# Patient Record
Sex: Female | Born: 1955 | Race: White | Hispanic: No | Marital: Married | Smoking: Former smoker
Health system: Southern US, Community
[De-identification: ages and names within clinical notes are randomized; demographics above are authoritative.]

## PROBLEM LIST (undated history)

## (undated) DIAGNOSIS — E559 Vitamin D deficiency, unspecified: Secondary | ICD-10-CM

## (undated) DIAGNOSIS — F32A Depression, unspecified: Secondary | ICD-10-CM

## (undated) DIAGNOSIS — F419 Anxiety disorder, unspecified: Secondary | ICD-10-CM

## (undated) DIAGNOSIS — R002 Palpitations: Secondary | ICD-10-CM

## (undated) DIAGNOSIS — M810 Age-related osteoporosis without current pathological fracture: Secondary | ICD-10-CM

## (undated) DIAGNOSIS — K219 Gastro-esophageal reflux disease without esophagitis: Secondary | ICD-10-CM

## (undated) DIAGNOSIS — G43909 Migraine, unspecified, not intractable, without status migrainosus: Secondary | ICD-10-CM

## (undated) DIAGNOSIS — E039 Hypothyroidism, unspecified: Secondary | ICD-10-CM

## (undated) DIAGNOSIS — F329 Major depressive disorder, single episode, unspecified: Secondary | ICD-10-CM

## (undated) DIAGNOSIS — I5181 Takotsubo syndrome: Secondary | ICD-10-CM

## (undated) HISTORY — DX: Depression, unspecified: F32.A

## (undated) HISTORY — DX: Anxiety disorder, unspecified: F41.9

## (undated) HISTORY — DX: Vitamin D deficiency, unspecified: E55.9

## (undated) HISTORY — DX: Age-related osteoporosis without current pathological fracture: M81.0

## (undated) HISTORY — DX: Gastro-esophageal reflux disease without esophagitis: K21.9

## (undated) HISTORY — PX: ABDOMINAL HYSTERECTOMY: SHX81

## (undated) HISTORY — DX: Palpitations: R00.2

## (undated) HISTORY — DX: Hypothyroidism, unspecified: E03.9

## (undated) HISTORY — DX: Takotsubo syndrome: I51.81

## (undated) HISTORY — DX: Migraine, unspecified, not intractable, without status migrainosus: G43.909

## (undated) HISTORY — DX: Major depressive disorder, single episode, unspecified: F32.9

---

## 1975-06-03 HISTORY — PX: NASAL SEPTUM SURGERY: SHX37

## 1997-08-21 ENCOUNTER — Ambulatory Visit (HOSPITAL_COMMUNITY): Admission: RE | Admit: 1997-08-21 | Discharge: 1997-08-21 | Payer: Self-pay | Admitting: *Deleted

## 1999-08-14 ENCOUNTER — Encounter: Admission: RE | Admit: 1999-08-14 | Discharge: 1999-08-14 | Payer: Self-pay | Admitting: Internal Medicine

## 1999-08-14 ENCOUNTER — Encounter: Payer: Self-pay | Admitting: Internal Medicine

## 1999-09-10 ENCOUNTER — Encounter: Admission: RE | Admit: 1999-09-10 | Discharge: 1999-10-17 | Payer: Self-pay | Admitting: Internal Medicine

## 2000-02-20 ENCOUNTER — Other Ambulatory Visit: Admission: RE | Admit: 2000-02-20 | Discharge: 2000-02-20 | Payer: Self-pay | Admitting: Obstetrics and Gynecology

## 2000-05-22 ENCOUNTER — Ambulatory Visit (HOSPITAL_COMMUNITY): Admission: RE | Admit: 2000-05-22 | Discharge: 2000-05-22 | Payer: Self-pay | Admitting: Gastroenterology

## 2000-05-22 ENCOUNTER — Encounter: Payer: Self-pay | Admitting: Gastroenterology

## 2002-01-14 ENCOUNTER — Encounter: Admission: RE | Admit: 2002-01-14 | Discharge: 2002-01-14 | Payer: Self-pay | Admitting: Internal Medicine

## 2002-01-14 ENCOUNTER — Encounter: Payer: Self-pay | Admitting: Internal Medicine

## 2002-06-23 ENCOUNTER — Other Ambulatory Visit: Admission: RE | Admit: 2002-06-23 | Discharge: 2002-06-23 | Payer: Self-pay | Admitting: Obstetrics and Gynecology

## 2004-01-31 ENCOUNTER — Other Ambulatory Visit: Admission: RE | Admit: 2004-01-31 | Discharge: 2004-01-31 | Payer: Self-pay | Admitting: Obstetrics and Gynecology

## 2004-04-12 ENCOUNTER — Encounter: Admission: RE | Admit: 2004-04-12 | Discharge: 2004-04-12 | Payer: Self-pay | Admitting: Gastroenterology

## 2005-07-30 ENCOUNTER — Other Ambulatory Visit: Admission: RE | Admit: 2005-07-30 | Discharge: 2005-07-30 | Payer: Self-pay | Admitting: Obstetrics and Gynecology

## 2007-01-18 ENCOUNTER — Ambulatory Visit (HOSPITAL_COMMUNITY): Admission: RE | Admit: 2007-01-18 | Discharge: 2007-01-18 | Payer: Self-pay | Admitting: Gastroenterology

## 2007-03-15 ENCOUNTER — Ambulatory Visit (HOSPITAL_COMMUNITY): Admission: RE | Admit: 2007-03-15 | Discharge: 2007-03-15 | Payer: Self-pay | Admitting: Surgery

## 2007-04-05 ENCOUNTER — Encounter: Admission: RE | Admit: 2007-04-05 | Discharge: 2007-04-05 | Payer: Self-pay | Admitting: Surgery

## 2007-04-12 ENCOUNTER — Encounter: Admission: RE | Admit: 2007-04-12 | Discharge: 2007-04-12 | Payer: Self-pay | Admitting: Surgery

## 2007-04-16 ENCOUNTER — Ambulatory Visit (HOSPITAL_COMMUNITY): Admission: RE | Admit: 2007-04-16 | Discharge: 2007-04-17 | Payer: Self-pay | Admitting: Surgery

## 2007-04-16 ENCOUNTER — Encounter (INDEPENDENT_AMBULATORY_CARE_PROVIDER_SITE_OTHER): Payer: Self-pay | Admitting: Surgery

## 2007-06-03 HISTORY — PX: NISSEN FUNDOPLICATION: SHX2091

## 2008-03-29 IMAGING — NM NM HEPATO W/GB/PHARM/[PERSON_NAME]
1 series · 6 of 6 positions shown · non-contrast
Comparison: none

CLINICAL DATA: Abdominal pain.  Nausea.  
 NUCLEAR MEDICINE HEPATOBILIARY SCAN WITH EJECTION FRACTION:
TECHNIQUE: Sequential abdominal images were obtained following intravenous injection of radiopharmaceutical.  Sequential images were continued following oral ingestion of 8 ounces of half-and-half and the gallbladder ejection fraction was calculated.
 Radiopharmaceutical:  5.0 mCi of Iechnetium-VVm Choletec.
 The radionuclide appears normally throughout the liver. There is rapid excretion into the intrahepatic ductal system with visualization of the common bile duct, gallbladder, and small bowel.  This represents a normal nuclear medicine hepatobiliary scan. The patient was then given 8 ounces of half-and-half cream orally.  The gallbladder ejection fraction was measured at 32% which is abnormally low.

[gb hepatobiliary · 4.66mm/px · 6 of 11 frames shown]
[frame 1/11]
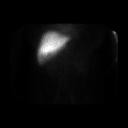
[frame 3/11]
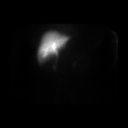
[frame 5/11]
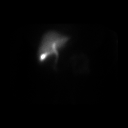
[frame 7/11]
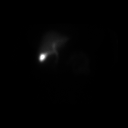
[frame 9/11]
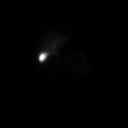
[frame 11/11]
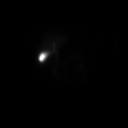

[6 of 6 positions shown; findings below may reference images not displayed]

IMPRESSION: 1.  Normal nuclear medicine hepatobiliary scan.
 2.  Low gallbladder ejection fraction of 32% at one hour.

## 2009-04-03 ENCOUNTER — Encounter (HOSPITAL_COMMUNITY): Admission: RE | Admit: 2009-04-03 | Discharge: 2009-06-01 | Payer: Self-pay | Admitting: Neurosurgery

## 2009-06-02 HISTORY — PX: BREAST REDUCTION SURGERY: SHX8

## 2009-08-27 ENCOUNTER — Ambulatory Visit (HOSPITAL_BASED_OUTPATIENT_CLINIC_OR_DEPARTMENT_OTHER): Admission: RE | Admit: 2009-08-27 | Discharge: 2009-08-28 | Payer: Self-pay | Admitting: Specialist

## 2010-08-26 LAB — POCT HEMOGLOBIN-HEMACUE: Hemoglobin: 13.4 g/dL (ref 12.0–15.0)

## 2010-10-09 ENCOUNTER — Encounter
Payer: Managed Care, Other (non HMO) | Attending: Physical Medicine and Rehabilitation | Admitting: Physical Medicine and Rehabilitation

## 2010-10-09 DIAGNOSIS — M549 Dorsalgia, unspecified: Secondary | ICD-10-CM | POA: Insufficient documentation

## 2010-10-09 DIAGNOSIS — M545 Low back pain, unspecified: Secondary | ICD-10-CM

## 2010-10-09 DIAGNOSIS — F341 Dysthymic disorder: Secondary | ICD-10-CM | POA: Insufficient documentation

## 2010-10-09 DIAGNOSIS — M47817 Spondylosis without myelopathy or radiculopathy, lumbosacral region: Secondary | ICD-10-CM | POA: Insufficient documentation

## 2010-10-10 NOTE — Group Therapy Note (Signed)
[name] is a pleasant 55 year old married woman who reports she has had back pain since 2007.  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  She states the pain continued to get worse.  She has seen a variety of health care providers over the last several years now.  She saw Dr. Channing Mutters in 2010, eventually obtained an MRI of her lumbar spine after x-rays and a bone scan were completed.  The MRI according to Dr. Temple Pacini notes showed some degenerative changes at L4-5 and L3-4 and she was treated nonoperatively.  She attended physical therapy in January 2011.  She eventually underwent a breast reduction in March 2011 per Dr. Shon Hough. She reports that this did help her mid back pain some, but not the low back.  She has tried water therapy.  She also saw Dr. Sharolyn Douglas.  She trialed muscle relaxers and injections and continues to use heat and ice intermittently.  She also went through a core strengthening program, was treated for some depression.  She is considering getting disability. She has an attorney currently and she still follows up with a chiropractor who sees her regularly.  Her goals at this time are to be able to sit through a meal and do something to earn some money again.  Her average pain is about 7 on a scale of 10.  Sleep tends to be poor. Pain is worse with sitting and standing.  Improves with rest, heat, ice, exercise, pacing her activities.  She reports fairly little relief with current meds.  Functional status.  She can walk about 30 minutes.  She does drive although driving does increase her low back pain.  She has a high school education and she has multiple life insures.  She is independent with feeding, dressing, bathing and toileting as well as meal prep.  She reports needing some assistance with household duties and shopping.  REVIEW OF SYSTEMS:  Positive for depression and anxiety which she relates are due  to her back pain.  She also reports night sweats, intermittent nausea, diarrhea, and constipation.  Current physicians involved in her care include her chiropractor, Dr. Channing Mutters her neurosurgeon, Dr. Evelene Croon her psychiatrist, Dr. Selena Batten and Dr. Shon Hough.  PAST MEDICAL HISTORY:  Positive for history of thyroid problems.  PAST SURGICAL HISTORY:  Breast reduction in March 2011, Nissen fundoplication October 2009, deviated septum in 1996, hysterectomy in 1990.  SOCIAL HISTORY:  The patient lives with her husband.  She denies illegal substance use.  She uses alcohol twice a year.  Denies smoking.  FAMILY HISTORY:  Positive for history of colon cancer with respect to her father who died at age 35.  Her mother age 27 is healthy.  She has 2 brothers and 2 sisters, 68, 30, 60 and 4.  One sister has cervical cancer.  Medications she brings in today include cyclobenzaprine 10 mg once a day.  She has been discontinued recently on trazodone, Cymbalta and Adderall.  She reports CYMBALTA made her face swell, TRAZODONE causes hair loss, and CODEINE makes her itch.  On exam today; her blood pressure is 107/61, pulse 69, respirations 18, 99% saturated on room air.  She is a well-developed, well-nourished woman who does not appear in any distress.  She is oriented x3.  Speech is clear.  Affect is bright.  She is alert, cooperative, and pleasant. Follows commands without difficulty.  Answers my questions appropriately.  Cranial nerves and coordination  are intact.  Her reflexes are 2+ in upper and lower extremities without clonus.  Hoffman test is negative.  Motor strength is good in both upper extremities, deltoid, biceps, triceps, brachioradialis, finger flexors and intrinsics.  Lower extremity strength 5/5 at hip flexors, knee extensors, dorsiflexors, plantar flexors, EHL.  Straight leg raise is negative.  No sensory deficits are appreciated in the upper or lower extremities.  Tandem gait and  Romberg test are all performed adequately.  She has some limitations in lumbar motion, reports increased pain with extension.  Palpation over the trochanters did not elicit significant tenderness over the greater trochanter region.  IMPRESSION:  Chronic low back pain.  We will obtain flexion/extension radiographs, review MRI.  I will review treatment options further with her after x-rays and MRI are reviewed.  May consider having her core muscle strength checked for physical therapy.  She states she has been doing some exercises.  I may have them reviewed her current exercise regime and also check her core strength to see how she is doing on that. I have answered all her questions.  She is comfortable with our plan.     Brantley Stage, M.D.    DMK/MedQ D:  10/09/2010 14:52:45  T:  10/10/2010 03:28:31  Job #:  [id number]

## 2010-10-15 NOTE — Op Note (Signed)
NAMEDYMIN, DINGLEDINE               ACCOUNT NO.:  [id number]   MEDICAL RECORD NO.:  [id number]          PATIENT TYPE:  AMB   LOCATION:  ENDO                         FACILITY:  [facility]   PHYSICIAN:  Petra Kuba, M.D.    DATE OF BIRTH:  1955/12/24   DATE OF PROCEDURE:  01/18/2007  DATE OF DISCHARGE:  01/18/2007                               OPERATIVE REPORT   PROCEDURE:  Manometry and 24 hour pH.   INDICATIONS:  Patient with reflux requested by Dr. Carolynne Edouard preop in a  patient with known hiatal hernia.   As an aside, the endoscopy unit did have some problems with the  equipment which did delay getting some of these results.   Manometry report and the pH were done in the [facility] in the customary  fashion and the computer generated report was given to me for my  evaluation.   MANOMETRY:  1. Upper esophageal sphincter. Slight increase in pressure but 99%      relaxation, normal residual pressure, slightly long duration, with      normal laryngeal peak pressures.  2. Esophageal body.      a.     Low normal amplitude was normal duration, normal velocity,       and 100% peristalsis.      b.     Upper low normal amplitude, normal duration and waveforms       appeared normal.  3. Lower esophageal sphincter. Very low normal LES pressure 10.1 with      normal being 10.  Unfortunately, the probe did not measure the      amount it relaxed or percent relaxation or duration which was one      of the malfunction of the probe.   IMPRESSION:  1. Essentially normal manometry with low normal LES and amplitude.      Unfortunately, no residual pressures or percent relaxation were      measured.   24 HOUR PH:  Assessment:  1. In the distal channel the patient did have a moderate amount of      reflux with a total composite DeMeester score of 112, normal being      22 or less, with both increased percentage time of acid reflux in      the upright, recumbent,and total, reflux episodes greater than  five      minutes were few, however, the longest was 45 minutes.  In the      proximal channel, however, she really had minimal reflux but      unfortunately the computer did not calculate the normals, but I      believe her values were all within normal limits.  Her symptoms did      not coordinate with her reflux episodes which goes along with      minimal proximal reflux, possibly.   ASSESSMENT:  Probably okay for hiatal hernia repair, although not a  complete study based on some malfunctioning of the probe and of the  computer reports.   PLAN:  Further workup and plans per Dr. Carolynne Edouard, I will be on standby  to  help p.r.n.           ______________________________  Petra Kuba, M.D.     MEM/MEDQ  D:  02/04/2007  T:  02/04/2007  Job:  [id number]   cc:   Erskine Speed, M.D.  Fax: [phone]   [facility] Salvia. Marcelle Overlie, M.D.  Fax: [phone]   Ollen Gross. Vernell Morgans, M.D.  [address]  [place]  [place] [address]

## 2010-10-15 NOTE — Discharge Summary (Signed)
NAMEISAURA, SCHILLER               ACCOUNT NO.:  [id number]   MEDICAL RECORD NO.:  [id number]          PATIENT TYPE:  OIB   LOCATION:  1540                         FACILITY:  [facility]   PHYSICIAN:  Lennie Muckle, MD      DATE OF BIRTH:  1955/11/21   DATE OF ADMISSION:  04/16/2007  DATE OF DISCHARGE:  04/17/2007                               DISCHARGE SUMMARY   Ms. Dewilde is a 55 year old female who had a laparoscopic paraesophageal  hernia repair, laparoscopic Nissen, over 56-French bougie, as well as a  laparoscopic cholecystectomy.  Postoperatively, she has done well, had  no difficulty swallowing liquids, is advancing to puree diet, has been  afebrile overnight.  Pain is well controlled, has mild complaints of  abdominal discomfort, otherwise is ambulating, voiding without  difficulty.  She will be discharged home with oxycodone elixir, is  instructed to perform no heavy lifting greater than 20 pounds for 2  weeks.  Also, was given Tylenol elixir for pain control.  The  prescriptions were given to her husband at discharge by Dr. Michaell Cowing.  She  will continue her Flexeril and iron thyroid supplement.  She will follow  up with Dr. Michaell Cowing in approximately 2 to 3 weeks.  She can shower and  remove her dressings tomorrow.      Lennie Muckle, MD  Electronically Signed     ALA/MEDQ  D:  04/17/2007  T:  04/18/2007  Job:  [id number]

## 2010-11-13 ENCOUNTER — Ambulatory Visit: Payer: Managed Care, Other (non HMO) | Admitting: Physical Medicine and Rehabilitation

## 2011-02-13 ENCOUNTER — Other Ambulatory Visit: Payer: Self-pay | Admitting: Gastroenterology

## 2011-03-11 LAB — COMPREHENSIVE METABOLIC PANEL
ALT: 15
AST: 14
Albumin: 4
Alkaline Phosphatase: 51
BUN: 7
CO2: 26
Calcium: 9
Chloride: 106
Creatinine, Ser: 0.92
GFR calc Af Amer: >60
GFR calc non Af Amer: >60
Glucose, Bld: 91
Potassium: 4.2
Sodium: 138
Total Bilirubin: 0.7
Total Protein: 6.7

## 2011-03-11 LAB — URINALYSIS, ROUTINE W REFLEX MICROSCOPIC
Bilirubin Urine: NEGATIVE
Glucose, UA: NEGATIVE
Hgb urine dipstick: NEGATIVE
Ketones, ur: NEGATIVE
Nitrite: NEGATIVE
Protein, ur: NEGATIVE
Specific Gravity, Urine: 1.026
Urobilinogen, UA: 0.2
pH: 5

## 2011-03-11 LAB — CBC
HCT: 41.5
Hemoglobin: 14.2
MCHC: 34.3
MCV: 89
Platelets: 243
RBC: 4.67
RDW: 14.3
WBC: 5.4

## 2011-03-11 LAB — T3: T3, Total: 110.6 (ref 80.0–204.0)

## 2011-03-11 LAB — T4: T4, Total: 6.6

## 2011-03-11 LAB — TSH: TSH: 7.75 — ABNORMAL HIGH

## 2011-03-11 LAB — T4, FREE: Free T4: 0.98

## 2014-03-15 ENCOUNTER — Other Ambulatory Visit: Payer: Self-pay | Admitting: Obstetrics and Gynecology

## 2014-03-16 LAB — CYTOLOGY - PAP

## 2014-06-21 ENCOUNTER — Ambulatory Visit: Payer: Managed Care, Other (non HMO) | Admitting: Family

## 2014-07-12 ENCOUNTER — Ambulatory Visit: Payer: Managed Care, Other (non HMO) | Admitting: Family

## 2014-09-25 DIAGNOSIS — E039 Hypothyroidism, unspecified: Secondary | ICD-10-CM | POA: Diagnosis not present

## 2014-09-25 DIAGNOSIS — E559 Vitamin D deficiency, unspecified: Secondary | ICD-10-CM | POA: Diagnosis not present

## 2014-09-25 DIAGNOSIS — E063 Autoimmune thyroiditis: Secondary | ICD-10-CM | POA: Diagnosis not present

## 2015-05-09 DIAGNOSIS — Z1231 Encounter for screening mammogram for malignant neoplasm of breast: Secondary | ICD-10-CM | POA: Diagnosis not present

## 2015-05-09 DIAGNOSIS — Z6824 Body mass index (BMI) 24.0-24.9, adult: Secondary | ICD-10-CM | POA: Diagnosis not present

## 2015-05-09 DIAGNOSIS — Z01419 Encounter for gynecological examination (general) (routine) without abnormal findings: Secondary | ICD-10-CM | POA: Diagnosis not present

## 2015-06-06 DIAGNOSIS — H524 Presbyopia: Secondary | ICD-10-CM | POA: Diagnosis not present

## 2015-06-21 DIAGNOSIS — J111 Influenza due to unidentified influenza virus with other respiratory manifestations: Secondary | ICD-10-CM | POA: Diagnosis not present

## 2015-07-31 DIAGNOSIS — F3181 Bipolar II disorder: Secondary | ICD-10-CM | POA: Diagnosis not present

## 2015-07-31 DIAGNOSIS — E039 Hypothyroidism, unspecified: Secondary | ICD-10-CM | POA: Diagnosis not present

## 2015-07-31 DIAGNOSIS — I10 Essential (primary) hypertension: Secondary | ICD-10-CM | POA: Diagnosis not present

## 2015-07-31 DIAGNOSIS — E559 Vitamin D deficiency, unspecified: Secondary | ICD-10-CM | POA: Diagnosis not present

## 2015-07-31 DIAGNOSIS — E063 Autoimmune thyroiditis: Secondary | ICD-10-CM | POA: Diagnosis not present

## 2015-09-04 DIAGNOSIS — N390 Urinary tract infection, site not specified: Secondary | ICD-10-CM | POA: Diagnosis not present

## 2015-12-18 DIAGNOSIS — E7800 Pure hypercholesterolemia, unspecified: Secondary | ICD-10-CM | POA: Diagnosis not present

## 2015-12-18 DIAGNOSIS — E559 Vitamin D deficiency, unspecified: Secondary | ICD-10-CM | POA: Diagnosis not present

## 2015-12-18 DIAGNOSIS — N390 Urinary tract infection, site not specified: Secondary | ICD-10-CM | POA: Diagnosis not present

## 2015-12-18 DIAGNOSIS — Z79899 Other long term (current) drug therapy: Secondary | ICD-10-CM | POA: Diagnosis not present

## 2015-12-18 DIAGNOSIS — Z0000 Encounter for general adult medical examination without abnormal findings: Secondary | ICD-10-CM | POA: Diagnosis not present

## 2015-12-18 DIAGNOSIS — E039 Hypothyroidism, unspecified: Secondary | ICD-10-CM | POA: Diagnosis not present

## 2016-01-01 DIAGNOSIS — E559 Vitamin D deficiency, unspecified: Secondary | ICD-10-CM | POA: Diagnosis not present

## 2016-01-01 DIAGNOSIS — A499 Bacterial infection, unspecified: Secondary | ICD-10-CM | POA: Diagnosis not present

## 2016-01-01 DIAGNOSIS — N390 Urinary tract infection, site not specified: Secondary | ICD-10-CM | POA: Diagnosis not present

## 2016-04-02 DIAGNOSIS — H9313 Tinnitus, bilateral: Secondary | ICD-10-CM | POA: Diagnosis not present

## 2016-04-02 DIAGNOSIS — E559 Vitamin D deficiency, unspecified: Secondary | ICD-10-CM | POA: Diagnosis not present

## 2016-05-06 DIAGNOSIS — Z800 Family history of malignant neoplasm of digestive organs: Secondary | ICD-10-CM | POA: Diagnosis not present

## 2016-05-06 DIAGNOSIS — Z86010 Personal history of colonic polyps: Secondary | ICD-10-CM | POA: Diagnosis not present

## 2016-05-06 DIAGNOSIS — D123 Benign neoplasm of transverse colon: Secondary | ICD-10-CM | POA: Diagnosis not present

## 2016-05-06 DIAGNOSIS — K5730 Diverticulosis of large intestine without perforation or abscess without bleeding: Secondary | ICD-10-CM | POA: Diagnosis not present

## 2016-05-06 DIAGNOSIS — K635 Polyp of colon: Secondary | ICD-10-CM | POA: Diagnosis not present

## 2016-05-06 DIAGNOSIS — D126 Benign neoplasm of colon, unspecified: Secondary | ICD-10-CM | POA: Diagnosis not present

## 2016-05-09 DIAGNOSIS — K635 Polyp of colon: Secondary | ICD-10-CM | POA: Diagnosis not present

## 2016-05-09 DIAGNOSIS — D126 Benign neoplasm of colon, unspecified: Secondary | ICD-10-CM | POA: Diagnosis not present

## 2016-05-12 DIAGNOSIS — M816 Localized osteoporosis [Lequesne]: Secondary | ICD-10-CM | POA: Diagnosis not present

## 2016-05-12 DIAGNOSIS — N958 Other specified menopausal and perimenopausal disorders: Secondary | ICD-10-CM | POA: Diagnosis not present

## 2016-05-12 DIAGNOSIS — Z01419 Encounter for gynecological examination (general) (routine) without abnormal findings: Secondary | ICD-10-CM | POA: Diagnosis not present

## 2016-05-12 DIAGNOSIS — Z13820 Encounter for screening for osteoporosis: Secondary | ICD-10-CM | POA: Diagnosis not present

## 2016-05-12 DIAGNOSIS — Z6824 Body mass index (BMI) 24.0-24.9, adult: Secondary | ICD-10-CM | POA: Diagnosis not present

## 2016-05-23 DIAGNOSIS — Z1231 Encounter for screening mammogram for malignant neoplasm of breast: Secondary | ICD-10-CM | POA: Diagnosis not present

## 2018-04-15 ENCOUNTER — Encounter: Payer: Self-pay | Admitting: Neurology

## 2018-04-19 ENCOUNTER — Telehealth: Payer: Self-pay | Admitting: Neurology

## 2018-04-19 ENCOUNTER — Encounter: Payer: Self-pay | Admitting: *Deleted

## 2018-04-19 ENCOUNTER — Encounter: Payer: Self-pay | Admitting: Neurology

## 2018-04-19 ENCOUNTER — Ambulatory Visit: Payer: Medicare Other | Admitting: Neurology

## 2018-04-19 VITALS — BP 122/75 | HR 63 | Ht 62.0 in | Wt 128.0 lb

## 2018-04-19 DIAGNOSIS — M542 Cervicalgia: Secondary | ICD-10-CM | POA: Insufficient documentation

## 2018-04-19 DIAGNOSIS — E7800 Pure hypercholesterolemia, unspecified: Secondary | ICD-10-CM | POA: Insufficient documentation

## 2018-04-19 DIAGNOSIS — R519 Headache, unspecified: Secondary | ICD-10-CM

## 2018-04-19 DIAGNOSIS — R51 Headache: Secondary | ICD-10-CM

## 2018-04-19 DIAGNOSIS — E079 Disorder of thyroid, unspecified: Secondary | ICD-10-CM

## 2018-04-19 DIAGNOSIS — E039 Hypothyroidism, unspecified: Secondary | ICD-10-CM | POA: Insufficient documentation

## 2018-04-19 MED ORDER — RIZATRIPTAN BENZOATE 5 MG PO TBDP: 5.0000 mg | ORAL_TABLET | ORAL | 12 refills | Status: DC | PRN

## 2018-04-19 NOTE — Telephone Encounter (Signed)
Patient is aware I gave her GI phone number of [phone] and to give them a call if she has not heard in the next 2-3 business days.  °

## 2018-04-19 NOTE — Progress Notes (Signed)
PATIENT: Heidi Evans DOB: [birth year]  Chief Complaint  Patient presents with  . New Patient (Initial Visit)    she is here with her husband Darrell for evaluation of headaches, tinnitus w/ hearing loss. Eyes have different prescriptions. Seen by [facility]. Has sleep disturbance and light sensitivity. Has been going on for awhile but worse in the last 6 months.   . Referral    Kellie Shropshire MD      HISTORICAL  Heidi Evans is a 62 year old female, seen in request by her primary care physician Dr. Royce Macadamia, Cristopher Estimable for evaluation of chronic headache, left neck pain, initial evaluation was on verbal 18 2019, she is accompanied by her husband at today's clinical visit.  I have reviewed and summarized the referring note from the referring physician.  She had a past medical history of bipolar disorder, is under the care of psychiatrist Dr. Edwyna Perfect, taking polypharmacy treatment, gabapentin 100 mg at bedtime, Trileptal 300 mg at bedtime, lamotrigine 400 mg a day,  She does have a history of chronic migraine headaches, previous migraine or retro-orbital area severe pounding headache with associated light noise sensitivity, nauseous, since 2015, she began to have daily headaches, woke up with holoacranial pressure headaches, also has light noise sensitivity, flashing light in her peripheral visual field, mild nauseous, lasting all day, she tried Tylenol without helping her symptoms.  In addition, she complains of left-sided neck pain, radiating pain to left shoulder, upper extremity, she denies trouble walking, left upper extremity sensorimotor deficit.  She has trouble sleeping, denies snoring, choking episodes.  REVIEW OF SYSTEMS: Full 14 system review of systems performed and notable only for weight loss, fatigue, chest pain, palpitation, hearing loss, ringing in the ear, blurry vision, double vision, eye pain, easy bruising, easy bleeding, feeling cold, memory loss, confusion,  headaches, passing out, insomnia, depression, anxiety, not enough energy, decreased energy, racing thoughts, skin sensitivity. All other review of systems were negative.  ALLERGIES: Allergies  Allergen Reactions  . Codeine Itching    HOME MEDICATIONS: Current Outpatient Medications  Medication Sig Dispense Refill  . diazepam (VALIUM) 5 MG tablet Take 5 mg by mouth 2 (two) times daily as needed for anxiety.    . gabapentin (NEURONTIN) 100 MG capsule Take 100 mg by mouth at bedtime.     . lamoTRIgine (LAMICTAL) 200 MG tablet Take 200-400 mg by mouth daily.     Marland Kitchen levothyroxine (SYNTHROID, LEVOTHROID) 100 MCG tablet Take 100 mcg by mouth daily. Alternate with levothyroxine 88 mcg    . levothyroxine (SYNTHROID, LEVOTHROID) 88 MCG tablet Take 88 mcg by mouth daily before breakfast. Alternate with levothyroxine 100 mcg    . Oxcarbazepine (TRILEPTAL) 300 MG tablet Take 300 mg by mouth at bedtime.     No current facility-administered medications for this visit.     PAST MEDICAL HISTORY: Past Medical History:  Diagnosis Date  . Acid reflux   . Anxiety   . Depression   . Hypothyroidism   . Migraine   . Osteoporosis   . Vitamin D deficiency     PAST SURGICAL HISTORY: Past Surgical History:  Procedure Laterality Date  . ABDOMINAL HYSTERECTOMY     total  . BREAST REDUCTION SURGERY  2011   Dr. Towanda Malkin  . NASAL SEPTUM SURGERY  1977  . NISSEN FUNDOPLICATION  0086   Dr. Johney Maine    FAMILY HISTORY: Family History  Problem Relation Age of Onset  . Migraines Mother   . Colon  cancer Father   . Headache Other        brain tumor  . Bipolar disorder Other        "in my family"  . Mental illness Other        "for different ones"    SOCIAL HISTORY: Social History   Socioeconomic History  . Marital status: Married    Spouse name: Not on file  . Number of children: 1  . Years of education: h.s. plus insurance vocational  . Highest education level: High school graduate    Occupational History  . Not on file  Social Needs  . Financial resource strain: Not on file  . Food insecurity:    Worry: Not on file    Inability: Not on file  . Transportation needs:    Medical: Not on file    Non-medical: Not on file  Tobacco Use  . Smoking status: Former Smoker    Types: Cigarettes    Last attempt to quit: 1994    Years since quitting: 25.8  . Smokeless tobacco: Never Used  Substance and Sexual Activity  . Alcohol use: Yes    Comment: rare 1-2 times per year  . Drug use: Not Currently    Types: Marijuana    Comment: in the past  . Sexual activity: Not on file  Lifestyle  . Physical activity:    Days per week: Not on file    Minutes per session: Not on file  . Stress: Not on file  Relationships  . Social connections:    Talks on phone: Not on file    Gets together: Not on file    Attends religious service: Not on file    Active member of club or organization: Not on file    Attends meetings of clubs or organizations: Not on file    Relationship status: Not on file  . Intimate partner violence:    Fear of current or ex partner: Not on file    Emotionally abused: Not on file    Physically abused: Not on file    Forced sexual activity: Not on file  Other Topics Concern  . Not on file  Social History Narrative   Lives at home with spouse   Right handed   Caffeine: 1.5 cups daily     PHYSICAL EXAM   Vitals:   04/19/18 0734  BP: 122/75  Pulse: 63  Weight: 128 lb (58.1 kg)  Height: 5\' 2"  (1.575 m)    Not recorded      Body mass index is 23.41 kg/m.  PHYSICAL EXAMNIATION:  Gen: NAD, conversant, well nourised, obese, well groomed                     Cardiovascular: Regular rate rhythm, no peripheral edema, warm, nontender. Eyes: Conjunctivae clear without exudates or hemorrhage Neck: Supple, no carotid bruits. Pulmonary: Clear to auscultation bilaterally   NEUROLOGICAL EXAM:  MENTAL STATUS: Speech:    Speech is normal; fluent  and spontaneous with normal comprehension.  Cognition:     Orientation to time, place and person     Normal recent and remote memory     Normal Attention span and concentration     Normal Language, naming, repeating,spontaneous speech     Fund of knowledge   CRANIAL NERVES: CN II: Visual fields are full to confrontation. Fundoscopic exam is normal with sharp discs and no vascular changes. Pupils are round equal and briskly reactive to light. CN  III, IV, VI: extraocular movement are normal. No ptosis. CN V: Facial sensation is intact to pinprick in all 3 divisions bilaterally. Corneal responses are intact.  CN VII: Face is symmetric with normal eye closure and smile. CN VIII: Hearing is normal to rubbing fingers CN IX, X: Palate elevates symmetrically. Phonation is normal. CN XI: Head turning and shoulder shrug are intact CN XII: Tongue is midline with normal movements and no atrophy.  MOTOR: There is no pronator drift of out-stretched arms. Muscle bulk and tone are normal. Muscle strength is normal.  REFLEXES: Reflexes are 2+ and symmetric at the biceps, triceps, knees, and ankles. Plantar responses are flexor.  SENSORY: Intact to light touch, pinprick, positional sensation and vibratory sensation are intact in fingers and toes.  COORDINATION: Rapid alternating movements and fine finger movements are intact. There is no dysmetria on finger-to-nose and heel-knee-shin.    GAIT/STANCE: Posture is normal. Gait is steady with normal steps, base, arm swing, and turning. Heel and toe walking are normal. Tandem gait is normal.  Romberg is absent.   DIAGNOSTIC DATA (LABS, IMAGING, TESTING) - I reviewed patient records, labs, notes, testing and imaging myself where available.   ASSESSMENT AND PLAN  RAYMONDE HAMBLIN is a 62 y.o. female   Chronic daily headaches  MRI of the brain to rule out structural lesion  With migraine features, her mood disorder likely play a role as well  She  is on polypharmacy already, Maxalt 5 mg as needed for severe prolonged headaches  Left neck pain, radiating pain to left shoulder upper extremity  MRI of the cervical spine to rule out left cervical radiculopathy    Marcial Pacas, M.D. Ph.D.  [facility] [address] [place], [place] [address] Ph: [phone] Fax: [phone]  CC: Sherald Hess., MD

## 2018-04-19 NOTE — Telephone Encounter (Signed)
[employer] medicare order sent to GI. No auth they will reach out to the pt to schedule.  °

## 2018-05-01 ENCOUNTER — Ambulatory Visit
Admission: RE | Admit: 2018-05-01 | Discharge: 2018-05-01 | Disposition: A | Discharge status: Home / Self Care | Payer: Managed Care, Other (non HMO) | Source: Ambulatory Visit | Attending: Neurology | Admitting: Neurology

## 2018-05-01 DIAGNOSIS — R51 Headache: Secondary | ICD-10-CM | POA: Diagnosis not present

## 2018-05-01 DIAGNOSIS — M542 Cervicalgia: Secondary | ICD-10-CM | POA: Diagnosis not present

## 2018-05-01 DIAGNOSIS — R519 Headache, unspecified: Secondary | ICD-10-CM

## 2018-05-03 ENCOUNTER — Telehealth: Payer: Self-pay | Admitting: Neurology

## 2018-05-03 DIAGNOSIS — R930 Abnormal findings on diagnostic imaging of skull and head, not elsewhere classified: Secondary | ICD-10-CM

## 2018-05-03 NOTE — Telephone Encounter (Signed)
Left message requesting a return call.

## 2018-05-03 NOTE — Telephone Encounter (Addendum)
Please call patient, MRI of brain showed small vessel disease, 8x8x9 mm left prepontine area calcified mass, likely meningioma, give her a follow up visit in 3 months, will have repeat MRI w/wo before follow up visit to better characterize.  MRI cervical showed multilevel degenerative changes, moderate foraminal narrowing at c6-7 level, potential encroaching on left C7, no evidence of spinal cord compression.     IMPRESSION: This MRI of the brain without contrast shows the following: 1.    Scattered T2/FLAIR hypertense foci in the subcortical deep white matter consistent with mild chronic microvascular ischemic change or the sequela of migraine. 2.    There is an 8 x 8 x 9 mm focus in the left prepontine space that is slightly distorts the adjacent pons.  It appears to be heavily calcified and might represent a pre-pontine meningioma or other mass.  Consider reimaging with postcontrast views and thin sections through the pons and prepontine cistern to better characterize. 3.    There are no acute findings.   IMPRESSION: This MRI of the cervical spine without contrast shows the following: 1.   Multilevel degenerative changes causing borderline spinal stenosis at C3-C4, C5-C6 and C6-C7. 2.   There is moderate foraminal narrowing at C6-C7 with degenerative changes encroaching upon the left C7 nerve root without causing definite nerve root compression.  There is less potential for nerve root compression at the other cervical levels. 3.    The spinal cord appears normal.

## 2018-05-03 NOTE — Telephone Encounter (Signed)
Noted, I will send the order to GI. They will reach out to the pt to schedule.

## 2018-05-03 NOTE — Telephone Encounter (Signed)
[employer] medicare order sent to GI. No auth they will reach out to the pt to schedule.  °

## 2018-05-03 NOTE — Telephone Encounter (Signed)
Patient returned the call. Please call and advise.

## 2018-05-03 NOTE — Telephone Encounter (Signed)
Returned call to patient and notified her of the cervical and brain MRI results as noted below.  She is agreeable to have repeat MRI prior to her next appt on 07/19/17.

## 2018-05-07 NOTE — Telephone Encounter (Signed)
Heidi Evans  Imaging has called asking if Dr Krista Blue wants pt's MRI done closer to her next office visit in Feb 2020.  Heidi Evans is asking to be called back at [phone] xt [phone]

## 2018-05-07 NOTE — Telephone Encounter (Signed)
Called, LVM for Terr[place] at [facility] imaging that she can have MRI completed prior to appt in 07/2018.

## 2018-05-19 ENCOUNTER — Other Ambulatory Visit: Payer: Managed Care, Other (non HMO)

## 2018-07-09 ENCOUNTER — Ambulatory Visit: Payer: Managed Care, Other (non HMO) | Admitting: Neurology

## 2018-07-12 ENCOUNTER — Ambulatory Visit
Admission: RE | Admit: 2018-07-12 | Discharge: 2018-07-12 | Disposition: A | Discharge status: Home / Self Care | Payer: Medicare Other | Source: Ambulatory Visit | Attending: Neurology | Admitting: Neurology

## 2018-07-12 DIAGNOSIS — R930 Abnormal findings on diagnostic imaging of skull and head, not elsewhere classified: Secondary | ICD-10-CM | POA: Diagnosis not present

## 2018-07-12 MED ORDER — GADOBENATE DIMEGLUMINE 529 MG/ML IV SOLN
10.0000 mL | Freq: Once | INTRAVENOUS | Status: AC | PRN
  Administered 2018-07-12: 10 mL via INTRAVENOUS

## 2018-07-14 ENCOUNTER — Telehealth: Payer: Self-pay | Admitting: Neurology

## 2018-07-14 NOTE — Telephone Encounter (Signed)
Please call patient, MRI of brain w/wo showed left prepontine area enhancing extra-axial mass 11x8x57mm, heavily calcified, likely meningioma. No significant change compared to previous scan in Dec 2019.   IMPRESSION: This MRI of the brain with and without contrast shows the following: 1.    There is an 11 x 8 x 10 mm enhancing extra-axial mass in the pre-pontine cistern on the left.    It appears to be heavily calcified and is most consistent with a prepontine meningioma.  It is adjacent to the left trigeminal nerve as a nerve enters Meckel's cave without clearly distorting it. 2.    Some scattered T2/flair hyperintense foci in the hemispheres consistent with mild stable chronic microvascular ischemic change. 3.    No acute findings.

## 2018-07-14 NOTE — Telephone Encounter (Signed)
Spoke to patient.  She is aware of her MRI results and will keep her pending appt to further review.

## 2018-07-19 ENCOUNTER — Ambulatory Visit: Payer: Medicare Other | Admitting: Neurology

## 2018-07-19 ENCOUNTER — Ambulatory Visit: Payer: Self-pay | Admitting: Neurology

## 2018-07-19 ENCOUNTER — Encounter: Payer: Self-pay | Admitting: Neurology

## 2018-07-19 VITALS — BP 127/74 | HR 65 | Ht 62.0 in | Wt 128.0 lb

## 2018-07-19 DIAGNOSIS — R51 Headache: Secondary | ICD-10-CM

## 2018-07-19 DIAGNOSIS — R519 Headache, unspecified: Secondary | ICD-10-CM

## 2018-07-19 MED ORDER — ERENUMAB-AOOE 70 MG/ML ~~LOC~~ SOAJ: 70.0000 mg | SUBCUTANEOUS | 3 refills | Status: DC

## 2018-07-19 NOTE — Progress Notes (Signed)
PATIENT: Heidi Evans DOB: Feb 27, 1956  REASON FOR VISIT: follow up HISTORY FROM: patient  HISTORY OF PRESENT ILLNESS:  HISTORY (April 19, 2018 Dr. Krista Blue) Maud Rubendall Knouff is a 63 year old female, seen in request by her primary care physician Dr. Gean Maidens for evaluation of chronic headache, left neck pain, initial evaluation was on verbal 18 2019, she is accompanied by her husband at today's clinical visit.  I have reviewed and summarized the referring note from the referring physician.  She had a past medical history of bipolar disorder, is under the care of psychiatrist Dr. Edwyna Perfect, taking polypharmacy treatment, gabapentin 100 mg at bedtime, Trileptal 300 mg at bedtime, lamotrigine 400 mg a day,  She does have a history of chronic migraine headaches, previous migraine or retro-orbital area severe pounding headache with associated light noise sensitivity, nauseous, since 2015, she began to have daily headaches, woke up with holoacranial pressure headaches, also has light noise sensitivity, flashing light in her peripheral visual field, mild nauseous, lasting all day, she tried Tylenol without helping her symptoms.  In addition, she complains of left-sided neck pain, radiating pain to left shoulder, upper extremity, she denies trouble walking, left upper extremity sensorimotor deficit.  She has trouble sleeping, denies snoring, choking episodes.  UPDATE July 19, 2018 SS Ms. Ramseyer is a 63 year old female who presents for follow-up today to discuss her MRI results.  She is accompanied by her husband at today's appointment. Initial evaluation was in November 2019 by Dr. Krista Blue for chronic headaches since 2015, and left neck pain.  She had an abnormal MRI of the brain in December 2019 showing an 8x8x9 left prepontine area calcified mass, likely meningioma.  She then had MRI of the brain with and without contrast in February 2020 showing a left pre-pontine area mass 11 x 8 x 10 mm,  likely meningioma.  She had a CT scan of her cervical spine in November 2019 that showed multilevel degenerative changes and moderate foraminal narrowing at C6-C7.    She reports that she continues to have 3 migraines per week.  She has been taking the Maxalt and she reports that it does relieve her headaches. Usually 1 tablet will improve her headaches, occasionally she has had to take 2 tablets.  She is currently taking gabapentin, Trileptal and Lamictal for her headaches in addition to her bipolar disorder.  In the past she is tried Topamax.  She reports when she has the headaches the pain starts to the back of her neck and then spread to her entire head causing light sensitivity and nausea.  She presents today for follow-up.  She is requesting a copy of her MRI reports and a CD disc to share with her primary care provider.   REVIEW OF SYSTEMS: Out of a complete 14 system review of symptoms, the patient complains only of the following symptoms, and all other reviewed systems are negative.  Fatigue, hearing loss, ringing in ears, light sensitivity, blurred vision, cold intolerance of thirst, insomnia, neck pain, neck stiffness, bruise/bleed easily, memory loss, dizziness, headache, passing out, depression, nervous/anxious, manic depression  ALLERGIES: Allergies  Allergen Reactions  . Codeine Itching    HOME MEDICATIONS: Outpatient Medications Prior to Visit  Medication Sig Dispense Refill  . diazepam (VALIUM) 5 MG tablet Take 5 mg by mouth 2 (two) times daily as needed for anxiety.    . gabapentin (NEURONTIN) 100 MG capsule Take 100 mg by mouth at bedtime.     . lamoTRIgine (LAMICTAL)  200 MG tablet Take 200-400 mg by mouth daily.     Marland Kitchen levothyroxine (SYNTHROID, LEVOTHROID) 100 MCG tablet Take 100 mcg by mouth daily. Alternate with levothyroxine 88 mcg    . levothyroxine (SYNTHROID, LEVOTHROID) 88 MCG tablet Take 88 mcg by mouth daily before breakfast. Alternate with levothyroxine 100 mcg      . Oxcarbazepine (TRILEPTAL) 300 MG tablet Take 300 mg by mouth at bedtime.    . rizatriptan (MAXALT-MLT) 5 MG disintegrating tablet Take 1 tablet (5 mg total) by mouth as needed. May repeat in 2 hours if needed, no refill in less than 30 days 10 tablet 12   No facility-administered medications prior to visit.     PAST MEDICAL HISTORY: Past Medical History:  Diagnosis Date  . Acid reflux   . Anxiety   . Depression   . Hypothyroidism   . Migraine   . Osteoporosis   . Vitamin D deficiency     PAST SURGICAL HISTORY: Past Surgical History:  Procedure Laterality Date  . ABDOMINAL HYSTERECTOMY     total  . BREAST REDUCTION SURGERY  2011   Dr. Towanda Malkin  . NASAL SEPTUM SURGERY  1977  . NISSEN FUNDOPLICATION  1093   Dr. Johney Maine    FAMILY HISTORY: Family History  Problem Relation Age of Onset  . Migraines Mother   . Colon cancer Father   . Headache Other        brain tumor  . Bipolar disorder Other        "in my family"  . Mental illness Other        "for different ones"    SOCIAL HISTORY: Social History   Socioeconomic History  . Marital status: Married    Spouse name: Not on file  . Number of children: 1  . Years of education: h.s. plus insurance vocational  . Highest education level: High school graduate  Occupational History  . Not on file  Social Needs  . Financial resource strain: Not on file  . Food insecurity:    Worry: Not on file    Inability: Not on file  . Transportation needs:    Medical: Not on file    Non-medical: Not on file  Tobacco Use  . Smoking status: Former Smoker    Types: Cigarettes    Last attempt to quit: 1994    Years since quitting: 26.1  . Smokeless tobacco: Never Used  Substance and Sexual Activity  . Alcohol use: Yes    Comment: rare 1-2 times per year  . Drug use: Not Currently    Types: Marijuana    Comment: in the past  . Sexual activity: Not on file  Lifestyle  . Physical activity:    Days per week: Not on file     Minutes per session: Not on file  . Stress: Not on file  Relationships  . Social connections:    Talks on phone: Not on file    Gets together: Not on file    Attends religious service: Not on file    Active member of club or organization: Not on file    Attends meetings of clubs or organizations: Not on file    Relationship status: Not on file  . Intimate partner violence:    Fear of current or ex partner: Not on file    Emotionally abused: Not on file    Physically abused: Not on file    Forced sexual activity: Not on file  Other Topics Concern  .  Not on file  Social History Narrative   Lives at home with spouse   Right handed   Caffeine: 1.5 cups daily      PHYSICAL EXAM  Vitals:   07/19/18 1339  BP: 127/74  Pulse: 65  Weight: 128 lb (58.1 kg)  Height: '5\' 2"'$  (1.575 m)   Body mass index is 23.41 kg/m.  Generalized: Well developed, in no acute distress   Neurological examination  Mentation: Alert oriented to time, place, history taking. Follows all commands speech and language fluent Cranial nerve II-XII: Pupils were equal round reactive to light. Extraocular movements were full, visual field were full on confrontational test. Facial sensation and strength were normal. Uvula tongue midline. Head turning and shoulder shrug  were normal and symmetric. Motor: The motor testing reveals 5 over 5 strength of all 4 extremities. Good symmetric motor tone is noted throughout.  Sensory: Sensory testing is intact to soft touch on all 4 extremities. No evidence of extinction is noted.  Coordination: Cerebellar testing reveals good finger-nose-finger and heel-to-shin bilaterally.  Gait and station: Gait is normal. Tandem gait is normal. Romberg is negative. No drift is seen.  Reflexes: Deep tendon reflexes are symmetric and normal bilaterally.   DIAGNOSTIC DATA (LABS, IMAGING, TESTING) - I reviewed patient records, labs, notes, testing and imaging myself where available.  Lab  Results  Component Value Date   WBC  04/13/2007    5.4 **Please note change in CBC/Diff reference range**   HGB 13.4 08/27/2009   HCT 41.5 04/13/2007   MCV 89.0 04/13/2007   PLT 243 04/13/2007      Component Value Date/Time   NA 138 04/13/2007 0830   K 4.2 04/13/2007 0830   CL 106 04/13/2007 0830   CO2 26 04/13/2007 0830   GLUCOSE 91 04/13/2007 0830   BUN 7 04/13/2007 0830   CREATININE 0.92 04/13/2007 0830   CALCIUM 9.0 04/13/2007 0830   PROT 6.7 04/13/2007 0830   ALBUMIN 4.0 04/13/2007 0830   AST 14 04/13/2007 0830   ALT 15 04/13/2007 0830   ALKPHOS 51 04/13/2007 0830   BILITOT 0.7 04/13/2007 0830   GFRNONAA >60 04/13/2007 0830   GFRAA  04/13/2007 0830    >60        The eGFR has been calculated using the MDRD equation. This calculation has not been validated in all clinical   No results found for: CHOL, HDL, LDLCALC, LDLDIRECT, TRIG, CHOLHDL No results found for: HGBA1C No results found for: VITAMINB12    I personally reviewed the MRI images and report with Dr. Krista Blue.  I then reviewed the findings with the patient.   ASSESSMENT AND PLAN 63 y.o. year old female   1.  Migraine headache 2.  Meningioma, incidental MRI finding  We had a very pleasant conversation today.  She is currently taking gabapentin, Lamictal, and Trileptal.  These medications have a dual benefit for her migraine headaches and bipolar disorder. She continues to have 3 migraine headaches per week.  Her headaches are significant and really affect her daily activity.  She was started on Maxalt as needed at her last visit in November 2019 and has had good benefit.  In the past she has tried Topamax for her headaches without benefit.  We discussed different options for her headaches.  Today we will start Aimovig 70 mg.  She was given a coupon card and information booklet.  If insurance does not approve Aimovig we would consider propanolol 40 mg twice a day.  We discussed her incidental MRI of the brain  findings of a meningioma.  We discussed that this is likely not the cause of her migraines.  We will continue to monitor this.  She will closely monitor her symptoms and let us know if she develops any new or worsening symptoms.  We may repeat her MRI in 1 year.  Otherwise, she will follow-up in 6 months for routine follow-up for her migraines.  I provided her with a printout of her MRI report findings and she will ask the front desk for a CD copy of her images.  I advised her to please keep me up-to-date with her status and to let me know if I can assist her with further medication management.   Butler Denmark, AGNP-C, DNP 07/19/2018, 3:18 PM [facility] [address] [place], [place] [address] [phone]

## 2018-07-20 ENCOUNTER — Telehealth: Payer: Self-pay

## 2018-07-20 NOTE — Telephone Encounter (Signed)
Pending approval for Aimovig 70 mg Key: [id number]   Rx #: [id number] ICD 10: R51  Will update once a decision has been made.

## 2018-07-20 NOTE — Progress Notes (Signed)
I have reviewed, discussed the case with NP Sarah and agreed above plan.

## 2018-07-22 NOTE — Telephone Encounter (Signed)
I received fax form from [employer] about the aimovig PA.  I called pt and asked her how many migraines in month she stated 15/month. (3.5 days per week).  She has treid topamax, amitriptyline, propranolol, maxalt. Lamotrigine, trileptal.  Form completed, fax confirmation received.  [phone].

## 2018-07-26 NOTE — Telephone Encounter (Signed)
Approval letter was sent to patient's pharmacy on file. Confirmation fax has been received.   [facility] - [place], [place] AT [facility] [address] [place] [phone] (Phone) [phone] (Fax)

## 2018-07-26 NOTE — Telephone Encounter (Signed)
[id number]. AIMOVIG INJ 70MG /ML is approved through 10/18/2018. For further questions, call [phone].

## 2018-09-02 ENCOUNTER — Telehealth: Payer: Self-pay | Admitting: *Deleted

## 2018-09-02 NOTE — Telephone Encounter (Signed)
Pt need to call [facility] to get a copy of her cds.

## 2018-10-07 NOTE — Telephone Encounter (Signed)
Received another PA for Aimovig. Covermymeds stated that this medication has been approved 07/20/2018 through 06/02/2019.

## 2018-10-21 ENCOUNTER — Inpatient Hospital Stay (HOSPITAL_COMMUNITY)
Admission: EM | Admit: 2018-10-21 | Discharge: 2018-10-25 | DRG: 389 | Disposition: A | Discharge status: Home / Self Care | Payer: Medicare Other | Attending: Internal Medicine | Admitting: Internal Medicine

## 2018-10-21 ENCOUNTER — Encounter (HOSPITAL_COMMUNITY): Payer: Self-pay

## 2018-10-21 ENCOUNTER — Other Ambulatory Visit: Payer: Self-pay

## 2018-10-21 DIAGNOSIS — E876 Hypokalemia: Secondary | ICD-10-CM | POA: Diagnosis present

## 2018-10-21 DIAGNOSIS — Z0189 Encounter for other specified special examinations: Secondary | ICD-10-CM

## 2018-10-21 DIAGNOSIS — Z1159 Encounter for screening for other viral diseases: Secondary | ICD-10-CM

## 2018-10-21 DIAGNOSIS — N390 Urinary tract infection, site not specified: Secondary | ICD-10-CM | POA: Diagnosis present

## 2018-10-21 DIAGNOSIS — G43909 Migraine, unspecified, not intractable, without status migrainosus: Secondary | ICD-10-CM | POA: Diagnosis present

## 2018-10-21 DIAGNOSIS — Z888 Allergy status to other drugs, medicaments and biological substances status: Secondary | ICD-10-CM

## 2018-10-21 DIAGNOSIS — M810 Age-related osteoporosis without current pathological fracture: Secondary | ICD-10-CM | POA: Diagnosis present

## 2018-10-21 DIAGNOSIS — K219 Gastro-esophageal reflux disease without esophagitis: Secondary | ICD-10-CM | POA: Diagnosis present

## 2018-10-21 DIAGNOSIS — F419 Anxiety disorder, unspecified: Secondary | ICD-10-CM | POA: Diagnosis present

## 2018-10-21 DIAGNOSIS — F32A Depression, unspecified: Secondary | ICD-10-CM | POA: Diagnosis present

## 2018-10-21 DIAGNOSIS — E559 Vitamin D deficiency, unspecified: Secondary | ICD-10-CM | POA: Diagnosis present

## 2018-10-21 DIAGNOSIS — Z885 Allergy status to narcotic agent status: Secondary | ICD-10-CM

## 2018-10-21 DIAGNOSIS — Z800 Family history of malignant neoplasm of digestive organs: Secondary | ICD-10-CM

## 2018-10-21 DIAGNOSIS — Z79890 Hormone replacement therapy (postmenopausal): Secondary | ICD-10-CM

## 2018-10-21 DIAGNOSIS — Z87891 Personal history of nicotine dependence: Secondary | ICD-10-CM

## 2018-10-21 DIAGNOSIS — K56609 Unspecified intestinal obstruction, unspecified as to partial versus complete obstruction: Principal | ICD-10-CM | POA: Diagnosis present

## 2018-10-21 DIAGNOSIS — Z90710 Acquired absence of both cervix and uterus: Secondary | ICD-10-CM

## 2018-10-21 DIAGNOSIS — Z79899 Other long term (current) drug therapy: Secondary | ICD-10-CM

## 2018-10-21 DIAGNOSIS — F329 Major depressive disorder, single episode, unspecified: Secondary | ICD-10-CM | POA: Diagnosis present

## 2018-10-21 DIAGNOSIS — E039 Hypothyroidism, unspecified: Secondary | ICD-10-CM | POA: Diagnosis present

## 2018-10-21 NOTE — ED Triage Notes (Signed)
Pt BIB [facility]. Pt c/o epigastric pain. Pt has extensive hx of GI issues. Due to previous surgery, pt should not be able to vomit, however pt had a small emesis tonight at 2300. Pt reports pain has lasted x2 hours and it usually passes, however the pain remains constant.   Pt normal BM is diarrhea (5/21).

## 2018-10-21 NOTE — ED Notes (Signed)
Bed: WA17 Expected date:  Expected time:  Means of arrival:  Comments: 63 yr old vomiting/abd pain

## 2018-10-22 ENCOUNTER — Emergency Department (HOSPITAL_COMMUNITY): Payer: Medicare Other

## 2018-10-22 ENCOUNTER — Encounter (HOSPITAL_COMMUNITY): Payer: Self-pay | Admitting: Emergency Medicine

## 2018-10-22 DIAGNOSIS — K219 Gastro-esophageal reflux disease without esophagitis: Secondary | ICD-10-CM | POA: Diagnosis present

## 2018-10-22 DIAGNOSIS — Z90710 Acquired absence of both cervix and uterus: Secondary | ICD-10-CM | POA: Diagnosis not present

## 2018-10-22 DIAGNOSIS — Z87891 Personal history of nicotine dependence: Secondary | ICD-10-CM | POA: Diagnosis not present

## 2018-10-22 DIAGNOSIS — K56609 Unspecified intestinal obstruction, unspecified as to partial versus complete obstruction: Secondary | ICD-10-CM | POA: Diagnosis present

## 2018-10-22 DIAGNOSIS — G43909 Migraine, unspecified, not intractable, without status migrainosus: Secondary | ICD-10-CM | POA: Diagnosis present

## 2018-10-22 DIAGNOSIS — Z885 Allergy status to narcotic agent status: Secondary | ICD-10-CM | POA: Diagnosis not present

## 2018-10-22 DIAGNOSIS — E559 Vitamin D deficiency, unspecified: Secondary | ICD-10-CM | POA: Diagnosis present

## 2018-10-22 DIAGNOSIS — M810 Age-related osteoporosis without current pathological fracture: Secondary | ICD-10-CM | POA: Diagnosis present

## 2018-10-22 DIAGNOSIS — N390 Urinary tract infection, site not specified: Secondary | ICD-10-CM

## 2018-10-22 DIAGNOSIS — F329 Major depressive disorder, single episode, unspecified: Secondary | ICD-10-CM

## 2018-10-22 DIAGNOSIS — F32A Depression, unspecified: Secondary | ICD-10-CM | POA: Diagnosis present

## 2018-10-22 DIAGNOSIS — E039 Hypothyroidism, unspecified: Secondary | ICD-10-CM | POA: Diagnosis present

## 2018-10-22 DIAGNOSIS — F419 Anxiety disorder, unspecified: Secondary | ICD-10-CM | POA: Diagnosis present

## 2018-10-22 DIAGNOSIS — Z1159 Encounter for screening for other viral diseases: Secondary | ICD-10-CM | POA: Diagnosis not present

## 2018-10-22 DIAGNOSIS — Z800 Family history of malignant neoplasm of digestive organs: Secondary | ICD-10-CM | POA: Diagnosis not present

## 2018-10-22 DIAGNOSIS — E876 Hypokalemia: Secondary | ICD-10-CM | POA: Diagnosis present

## 2018-10-22 DIAGNOSIS — Z888 Allergy status to other drugs, medicaments and biological substances status: Secondary | ICD-10-CM | POA: Diagnosis not present

## 2018-10-22 DIAGNOSIS — Z79899 Other long term (current) drug therapy: Secondary | ICD-10-CM | POA: Diagnosis not present

## 2018-10-22 DIAGNOSIS — Z79890 Hormone replacement therapy (postmenopausal): Secondary | ICD-10-CM | POA: Diagnosis not present

## 2018-10-22 LAB — CBC WITH DIFFERENTIAL/PLATELET
Abs Immature Granulocytes: 0.02 10*3/uL (ref 0.00–0.07)
Basophils Absolute: 0.1 10*3/uL (ref 0.0–0.1)
Basophils Relative: 1 %
Eosinophils Absolute: 0.1 10*3/uL (ref 0.0–0.5)
Eosinophils Relative: 1 %
HCT: 40.5 % (ref 36.0–46.0)
Hemoglobin: 13.5 g/dL (ref 12.0–15.0)
Immature Granulocytes: 0 %
Lymphocytes Relative: 12 %
Lymphs Abs: 1.2 10*3/uL (ref 0.7–4.0)
MCH: 30.7 pg (ref 26.0–34.0)
MCHC: 33.3 g/dL (ref 30.0–36.0)
MCV: 92 fL (ref 80.0–100.0)
Monocytes Absolute: 0.6 10*3/uL (ref 0.1–1.0)
Monocytes Relative: 6 %
Neutro Abs: 8.3 10*3/uL — ABNORMAL HIGH (ref 1.7–7.7)
Neutrophils Relative %: 80 %
Platelets: 301 10*3/uL (ref 150–400)
RBC: 4.4 MIL/uL (ref 3.87–5.11)
RDW: 12.4 % (ref 11.5–15.5)
WBC: 10.3 10*3/uL (ref 4.0–10.5)
nRBC: 0 % (ref 0.0–0.2)

## 2018-10-22 LAB — URINALYSIS, ROUTINE W REFLEX MICROSCOPIC
Bilirubin Urine: NEGATIVE
Glucose, UA: NEGATIVE mg/dL
Hgb urine dipstick: NEGATIVE
Ketones, ur: 5 mg/dL — AB
Nitrite: NEGATIVE
Protein, ur: 30 mg/dL — AB
Specific Gravity, Urine: 1.013 (ref 1.005–1.030)
pH: 9 — ABNORMAL HIGH (ref 5.0–8.0)

## 2018-10-22 LAB — COMPREHENSIVE METABOLIC PANEL
ALT: 16 U/L (ref 0–44)
AST: 32 U/L (ref 15–41)
Albumin: 4.6 g/dL (ref 3.5–5.0)
Alkaline Phosphatase: 49 U/L (ref 38–126)
Anion gap: 13 (ref 5–15)
BUN: 9 mg/dL (ref 8–23)
CO2: 23 mmol/L (ref 22–32)
Calcium: 9.5 mg/dL (ref 8.9–10.3)
Chloride: 100 mmol/L (ref 98–111)
Creatinine, Ser: 0.92 mg/dL (ref 0.44–1.00)
GFR calc Af Amer: >60 mL/min (ref >60)
GFR calc non Af Amer: >60 mL/min (ref >60)
Glucose, Bld: 135 mg/dL — ABNORMAL HIGH (ref 70–99)
Potassium: 4.9 mmol/L (ref 3.5–5.1)
Sodium: 136 mmol/L (ref 135–145)
Total Bilirubin: 1.3 mg/dL — ABNORMAL HIGH (ref 0.3–1.2)
Total Protein: 7.1 g/dL (ref 6.5–8.1)

## 2018-10-22 LAB — HIV ANTIBODY (ROUTINE TESTING W REFLEX): HIV Screen 4th Generation wRfx: NONREACTIVE

## 2018-10-22 LAB — LIPASE, BLOOD: Lipase: 36 U/L (ref 11–51)

## 2018-10-22 LAB — SARS CORONAVIRUS 2 BY RT PCR (HOSPITAL ORDER, PERFORMED IN ~~LOC~~ HOSPITAL LAB): SARS Coronavirus 2: NEGATIVE

## 2018-10-22 LAB — TROPONIN I: Troponin I: <0.03 ng/mL (ref <0.03)

## 2018-10-22 LAB — PROTIME-INR
INR: 1.1 (ref 0.8–1.2)
Prothrombin Time: 14.3 seconds (ref 11.4–15.2)

## 2018-10-22 LAB — APTT: aPTT: 28 seconds (ref 24–36)

## 2018-10-22 MED ORDER — SODIUM CHLORIDE 0.9 % IV SOLN
INTRAVENOUS | Status: AC
  Administered 2018-10-22 (×2): via INTRAVENOUS

## 2018-10-22 MED ORDER — KETOROLAC TROMETHAMINE 30 MG/ML IJ SOLN
15.0000 mg | Freq: Once | INTRAMUSCULAR | Status: AC
  Administered 2018-10-22: 15 mg via INTRAVENOUS
  Filled 2018-10-22: qty 1

## 2018-10-22 MED ORDER — ACETAMINOPHEN 650 MG RE SUPP: 650.0000 mg | Freq: Four times a day (QID) | RECTAL | Status: DC | PRN

## 2018-10-22 MED ORDER — ALUM & MAG HYDROXIDE-SIMETH 200-200-20 MG/5ML PO SUSP
30.0000 mL | Freq: Once | ORAL | Status: AC
  Administered 2018-10-22: 30 mL via ORAL
  Filled 2018-10-22: qty 30

## 2018-10-22 MED ORDER — ONDANSETRON HCL 4 MG/2ML IJ SOLN
4.0000 mg | Freq: Once | INTRAMUSCULAR | Status: AC
  Administered 2018-10-22: 4 mg via INTRAVENOUS
  Filled 2018-10-22: qty 2

## 2018-10-22 MED ORDER — LIDOCAINE VISCOUS HCL 2 % MT SOLN
15.0000 mL | Freq: Once | OROMUCOSAL | Status: AC
  Administered 2018-10-22: 15 mL via ORAL
  Filled 2018-10-22: qty 15

## 2018-10-22 MED ORDER — SODIUM CHLORIDE (PF) 0.9 % IJ SOLN
INTRAMUSCULAR | Status: AC
  Administered 2018-10-22: 3 mL
  Filled 2018-10-22: qty 50

## 2018-10-22 MED ORDER — IOHEXOL 300 MG/ML  SOLN
100.0000 mL | Freq: Once | INTRAMUSCULAR | Status: AC | PRN
  Administered 2018-10-22: 100 mL via INTRAVENOUS

## 2018-10-22 MED ORDER — SODIUM CHLORIDE 0.9 % IV BOLUS
1000.0000 mL | Freq: Once | INTRAVENOUS | Status: AC
  Administered 2018-10-22: 1000 mL via INTRAVENOUS

## 2018-10-22 MED ORDER — SODIUM CHLORIDE 0.9 % IV SOLN
1.0000 g | INTRAVENOUS | Status: DC
  Filled 2018-10-22: qty 10

## 2018-10-22 MED ORDER — ACETAMINOPHEN 325 MG PO TABS
650.0000 mg | ORAL_TABLET | Freq: Four times a day (QID) | ORAL | Status: DC | PRN
  Filled 2018-10-22: qty 2

## 2018-10-22 MED ORDER — ONDANSETRON HCL 4 MG/2ML IJ SOLN: 4.0000 mg | Freq: Four times a day (QID) | INTRAMUSCULAR | Status: DC | PRN

## 2018-10-22 MED ORDER — LEVOTHYROXINE SODIUM 100 MCG/5ML IV SOLN
50.0000 ug | Freq: Every day | INTRAVENOUS | Status: DC
  Administered 2018-10-22 – 2018-10-24 (×3): 50 ug via INTRAVENOUS
  Filled 2018-10-22 (×3): qty 5

## 2018-10-22 MED ORDER — SODIUM CHLORIDE 0.9 % IV SOLN
1.0000 g | Freq: Once | INTRAVENOUS | Status: AC
  Administered 2018-10-22: 1 g via INTRAVENOUS
  Filled 2018-10-22: qty 10

## 2018-10-22 MED ORDER — FENTANYL CITRATE (PF) 100 MCG/2ML IJ SOLN
50.0000 ug | Freq: Once | INTRAMUSCULAR | Status: AC
  Administered 2018-10-22: 50 ug via INTRAVENOUS
  Filled 2018-10-22: qty 2

## 2018-10-22 MED ORDER — HYDROMORPHONE HCL 1 MG/ML IJ SOLN
1.0000 mg | INTRAMUSCULAR | Status: DC | PRN
  Administered 2018-10-22: 1 mg via INTRAVENOUS
  Filled 2018-10-22: qty 1

## 2018-10-22 MED ORDER — LORAZEPAM 2 MG/ML IJ SOLN
0.5000 mg | Freq: Four times a day (QID) | INTRAMUSCULAR | Status: DC | PRN
  Administered 2018-10-23 (×2): 0.5 mg via INTRAVENOUS
  Filled 2018-10-22 (×2): qty 1

## 2018-10-22 NOTE — Progress Notes (Signed)
Patient is a 63 year old female with history of Nissen fundoplication, GERD, abdominal hysterectomy, hypothyroidism, depression, migraines who presented to the emergency department with complaints of abdominal pain, nausea and vomiting. CT abdomen/pelvis showed distal small bowel obstruction with transition in the left pelvis.  No perforation.  Started on conservative management.  General surgery consulted.  NG tube placed. Patient seen and examined the bedside this morning.  Hemodynamically stable.  Denies any abdominal pain during my evaluation.  Has not passed any flatus.  No bowel movement.  NG tube draining yellow-brownish fluid. We will continue current management. Patient seen by Dr. Maudie Mercury this morning.

## 2018-10-22 NOTE — H&P (Addendum)
TRH H&P    Patient Demographics:    Heidi Evans, is a 63 y.o. female  MRN: [id number]  DOB - [birth year]  Admit Date - 10/21/2018  Referring MD/NP/PA: April Palumbo  Outpatient Primary MD for the patient is Sherald Hess., MD  Patient coming from:  home  Chief complaint- abdominal pain   HPI:    Heidi Evans  is a 63 y.o. female,w hx of nissen fundoplication, Gerd,abdominal hysterectomy,  hypothyroidism, depression, migraines, vit D deficiency/ osteoporosis apparently presents with c/o abdominal discomfort, epigastric, area, as well as small emesis earlier tonight.  Pt states typically her pain disappears after an hour or 2 but this time persisted and therefore presented to ED.  Pt denies fever, chills, cp, palp, cough, hematemesis, diarrhea, constipation, brbpr.    In ED T 97.9 P 53, Bp 107/57  Pox 95%,  Wt 56.7 kg  CT abd/ pelvis IMPRESSION: 1. Distal small bowel obstruction with transition in the left pelvis, possibly a lesions. No findings of perforation. 2. Small hiatal hernia. 3. Aortic Atherosclerosis (ICD10-I70.0). 4. Small hiatal hernia.  EKG nsr at 60, nl axis, nl int, no st-t changes c/w ischemia  Wbc 10.3 Hgb 13.5, Plt 301 Na 136, K 4.9, Bun 9, Creatinine 0.92 Glucose 135 Ast 32, Alt 16, Alk phos 49, T. Bili 1.3  Pt given fentanyl 50 micrograms iv x1 in the ED, along with ketorolac '15mg'$  iv x1.    ED consulted surgery, Dr. Harlow Asa who recommended NGT to lower intermittent suction, NPO, and pain control.    Pt will be admitted for SBO.     Review of systems:    In addition to the HPI above,  No Fever-chills, No Headache, No changes with Vision or hearing, No problems swallowing food or Liquids, No Chest pain, Cough or Shortness of Breath,  No Blood in stool or Urine, No dysuria, No new skin rashes or bruises, No new joints pains-aches,  No new weakness, tingling,  numbness in any extremity, No recent weight gain or loss, No polyuria, polydypsia or polyphagia, No significant Mental Stressors.  All other systems reviewed and are negative.    Past History of the following :    Past Medical History:  Diagnosis Date  . Acid reflux   . Anxiety   . Depression   . Hypothyroidism   . Migraine   . Osteoporosis   . Vitamin D deficiency       Past Surgical History:  Procedure Laterality Date  . ABDOMINAL HYSTERECTOMY     total  . BREAST REDUCTION SURGERY  2011   Dr. Towanda Malkin  . NASAL SEPTUM SURGERY  1977  . NISSEN FUNDOPLICATION  2122   Dr. Johney Maine      Social History:      Social History   Tobacco Use  . Smoking status: Former Smoker    Types: Cigarettes    Last attempt to quit: 1994    Years since quitting: 26.4  . Smokeless tobacco: Never Used  Substance Use Topics  . Alcohol use: Yes  Comment: rare 1-2 times per year       Family History :     Family History  Problem Relation Age of Onset  . Migraines Mother   . Colon cancer Father   . Headache Other        brain tumor  . Bipolar disorder Other        "in my family"  . Mental illness Other        "for different ones"       Home Medications:   Prior to Admission medications   Medication Sig Start Date End Date Taking? Authorizing Provider  diazepam (VALIUM) 5 MG tablet Take 5 mg by mouth 2 (two) times daily as needed for anxiety.   Yes [provider]  gabapentin (NEURONTIN) 100 MG capsule Take 100 mg by mouth at bedtime.  03/09/18  Yes [provider]  lamoTRIgine (LAMICTAL) 200 MG tablet Take 50 mg by mouth daily.  02/25/18  Yes [provider]  levothyroxine (SYNTHROID, LEVOTHROID) 100 MCG tablet Take 100 mcg by mouth daily. Alternate with levothyroxine 88 mcg 03/24/18  Yes [provider]  levothyroxine (SYNTHROID, LEVOTHROID) 88 MCG tablet Take 88 mcg by mouth daily before breakfast. Alternate with levothyroxine 100 mcg    Yes [provider]  Oxcarbazepine (TRILEPTAL) 300 MG tablet Take 150 mg by mouth at bedtime.    Yes [provider]  rizatriptan (MAXALT-MLT) 5 MG disintegrating tablet Take 1 tablet (5 mg total) by mouth as needed. May repeat in 2 hours if needed, no refill in less than 30 days 04/19/18  Yes Marcial Pacas, MD  Erenumab-aooe (AIMOVIG) 70 MG/ML SOAJ Inject 70 mg into the skin every 30 (thirty) days. 07/19/18   Suzzanne Cloud, NP     Allergies:     Allergies  Allergen Reactions  . Codeine Itching  . Other Other (See Comments)    Some anesthesia medications cause her nausea (unknown which)     Physical Exam:   Vitals  Blood pressure (!) 107/57, pulse (!) 53, temperature 97.9 F (36.6 C), resp. rate 13, height '5\' 2"'$  (1.575 m), weight 56.7 kg, SpO2 95 %.  1.  General: axox3  2. Psychiatric: euthymic  3. Neurologic: cn2-12 intact, reflexes 2+ symmetric, diffuse with no clonus, motor 5/5 in all 4 ext  4. HEENMT:  Anicteric, pupils, 1.47m symmetric, direct, consensual, near intact Mmm Neck: no jvd   5. Respiratory : CTAB  6. Cardiovascular : rrr s1, s2, no m/g/r  7. Gastrointestinal:  Abd: soft, nt, nd, +bs  8. Skin:  Ext: no c/c/e, no rash  9.Musculoskeletal:  Good ROM  No adenopathy     Data Review:    CBC Recent Labs  Lab 10/22/18 0020  WBC 10.3  HGB 13.5  HCT 40.5  PLT 301  MCV 92.0  MCH 30.7  MCHC 33.3  RDW 12.4  LYMPHSABS 1.2  MONOABS 0.6  EOSABS 0.1  BASOSABS 0.1   ------------------------------------------------------------------------------------------------------------------  Results for orders placed or performed during the hospital encounter of 10/21/18 (from the past 48 hour(s))  CBC with Differential/Platelet     Status: Abnormal   Collection Time: 10/22/18 12:20 AM  Result Value Ref Range   WBC 10.3 4.0 - 10.5 K/uL   RBC 4.40 3.87 - 5.11 MIL/uL   Hemoglobin 13.5 12.0 - 15.0 g/dL   HCT 40.5 36.0 - 46.0 %   MCV  92.0 80.0 - 100.0 fL   MCH 30.7 26.0 - 34.0 pg  MCHC 33.3 30.0 - 36.0 g/dL   RDW 12.4 11.5 - 15.5 %   Platelets 301 150 - 400 K/uL   nRBC 0.0 0.0 - 0.2 %   Neutrophils Relative % 80 %   Neutro Abs 8.3 (H) 1.7 - 7.7 K/uL   Lymphocytes Relative 12 %   Lymphs Abs 1.2 0.7 - 4.0 K/uL   Monocytes Relative 6 %   Monocytes Absolute 0.6 0.1 - 1.0 K/uL   Eosinophils Relative 1 %   Eosinophils Absolute 0.1 0.0 - 0.5 K/uL   Basophils Relative 1 %   Basophils Absolute 0.1 0.0 - 0.1 K/uL   Immature Granulocytes 0 %   Abs Immature Granulocytes 0.02 0.00 - 0.07 K/uL    Comment: Performed at [facility], [place] [address], [place], [place] [address]  Comprehensive metabolic panel     Status: Abnormal   Collection Time: 10/22/18 12:20 AM  Result Value Ref Range   Sodium 136 135 - 145 mmol/L   Potassium 4.9 3.5 - 5.1 mmol/L   Chloride 100 98 - 111 mmol/L   CO2 23 22 - 32 mmol/L   Glucose, Bld 135 (H) 70 - 99 mg/dL   BUN 9 8 - 23 mg/dL   Creatinine, Ser 0.92 0.44 - 1.00 mg/dL   Calcium 9.5 8.9 - 10.3 mg/dL   Total Protein 7.1 6.5 - 8.1 g/dL   Albumin 4.6 3.5 - 5.0 g/dL   AST 32 15 - 41 U/L   ALT 16 0 - 44 U/L   Alkaline Phosphatase 49 38 - 126 U/L   Total Bilirubin 1.3 (H) 0.3 - 1.2 mg/dL   GFR calc non Af Amer >60 >60 mL/min   GFR calc Af Amer >60 >60 mL/min   Anion gap 13 5 - 15    Comment: Performed at [facility], [place] [address], [place], [place] [address]  Lipase, blood     Status: None   Collection Time: 10/22/18 12:20 AM  Result Value Ref Range   Lipase 36 11 - 51 U/L    Comment: Performed at [facility], [place] [address] [place], [place] [address]  Troponin I - ONCE - STAT     Status: None   Collection Time: 10/22/18 12:20 AM  Result Value Ref Range   Troponin I <0.03 <0.03 ng/mL    Comment: Performed at [facility], [place] [address] [place], [place] [address]  Urinalysis, Routine w reflex microscopic      Status: Abnormal   Collection Time: 10/22/18 12:34 AM  Result Value Ref Range   Color, Urine YELLOW YELLOW   APPearance CLEAR CLEAR   Specific Gravity, Urine 1.013 1.005 - 1.030   pH 9.0 (H) 5.0 - 8.0   Glucose, UA NEGATIVE NEGATIVE mg/dL   Hgb urine dipstick NEGATIVE NEGATIVE   Bilirubin Urine NEGATIVE NEGATIVE   Ketones, ur 5 (A) NEGATIVE mg/dL   Protein, ur 30 (A) NEGATIVE mg/dL   Nitrite NEGATIVE NEGATIVE   Leukocytes,Ua MODERATE (A) NEGATIVE   RBC / HPF 0-5 0 - 5 RBC/hpf   WBC, UA 21-50 0 - 5 WBC/hpf   Bacteria, UA RARE (A) NONE SEEN   Squamous Epithelial / LPF 0-5 0 - 5   Mucus PRESENT    Amorphous Crystal PRESENT    Ca Oxalate Crys, UA PRESENT     Comment: Performed at [facility], [place] [address] [place], [place] [address]    Chemistries  Recent Labs  Lab 10/22/18 0020  NA 136  K 4.9  CL 100  CO2 23  GLUCOSE 135*  BUN 9  CREATININE 0.92  CALCIUM 9.5  AST 32  ALT 16  ALKPHOS 49  BILITOT 1.3*   ------------------------------------------------------------------------------------------------------------------  ------------------------------------------------------------------------------------------------------------------ GFR: Estimated Creatinine Clearance: 49.5 mL/min (by C-G formula based on SCr of 0.92 mg/dL). Liver Function Tests: Recent Labs  Lab 10/22/18 0020  AST 32  ALT 16  ALKPHOS 49  BILITOT 1.3*  PROT 7.1  ALBUMIN 4.6   Recent Labs  Lab 10/22/18 0020  LIPASE 36   No results for input(s): AMMONIA in the last 168 hours. Coagulation Profile: No results for input(s): INR, PROTIME in the last 168 hours. Cardiac Enzymes: Recent Labs  Lab 10/22/18 0020  TROPONINI <0.03   BNP (last 3 results) No results for input(s): PROBNP in the last 8760 hours. HbA1C: No results for input(s): HGBA1C in the last 72 hours. CBG: No results for input(s): GLUCAP in the last 168 hours. Lipid Profile: No results for input(s):  CHOL, HDL, LDLCALC, TRIG, CHOLHDL, LDLDIRECT in the last 72 hours. Thyroid Function Tests: No results for input(s): TSH, T4TOTAL, FREET4, T3FREE, THYROIDAB in the last 72 hours. Anemia Panel: No results for input(s): VITAMINB12, FOLATE, FERRITIN, TIBC, IRON, RETICCTPCT in the last 72 hours.  --------------------------------------------------------------------------------------------------------------- Urine analysis:    Component Value Date/Time   COLORURINE YELLOW 10/22/2018 0034   APPEARANCEUR CLEAR 10/22/2018 0034   LABSPEC 1.013 10/22/2018 0034   PHURINE 9.0 (H) 10/22/2018 0034   GLUCOSEU NEGATIVE 10/22/2018 0034   HGBUR NEGATIVE 10/22/2018 0034   BILIRUBINUR NEGATIVE 10/22/2018 0034   KETONESUR 5 (A) 10/22/2018 0034   PROTEINUR 30 (A) 10/22/2018 0034   UROBILINOGEN 0.2 04/13/2007 0825   NITRITE NEGATIVE 10/22/2018 0034   LEUKOCYTESUR MODERATE (A) 10/22/2018 0034      Imaging Results:    Ct Abdomen Pelvis W Contrast  Result Date: 10/22/2018 CLINICAL DATA:  63 y/o  F; epigastric pain and emesis. EXAM: CT ABDOMEN AND PELVIS WITH CONTRAST TECHNIQUE: Multidetector CT imaging of the abdomen and pelvis was performed using the standard protocol following bolus administration of intravenous contrast. CONTRAST:  174m OMNIPAQUE IOHEXOL 300 MG/ML  SOLN COMPARISON:  12/24/2005 CT abdomen and pelvis. FINDINGS: Lower chest: No acute abnormality. Small hiatal hernia. Hepatobiliary: 12 mm cyst in the right lobe of liver. No focal liver abnormality is seen. Status post cholecystectomy. No biliary dilatation. Pancreas: Unremarkable. No pancreatic ductal dilatation or surrounding inflammatory changes. Spleen: Normal in size without focal abnormality. Adrenals/Urinary Tract: Adrenal glands are unremarkable. Multiple renal cysts measuring up to 49 mm in the lower pole of right kidney. Otherwise kidneys are normal, without renal calculi, focal lesion, or hydronephrosis. Bladder is unremarkable.  Stomach/Bowel: Distal small bowel obstruction which appears to transition in the left hemipelvis (series 2, image 55 and series 5 images 60-80). No findings of perforation. Normal appearance of the colon and appendix. Stomach is unremarkable. Postsurgical changes at the gastroesophageal junction from prior Nissen fundoplication. Vascular/Lymphatic: Aortic atherosclerosis. No enlarged abdominal or pelvic lymph nodes. Reproductive: Status post hysterectomy. No adnexal masses. Other: No abdominal wall hernia or abnormality. No abdominopelvic ascites. Musculoskeletal: No fracture is seen. IMPRESSION: 1. Distal small bowel obstruction with transition in the left pelvis, possibly a lesions. No findings of perforation. 2. Small hiatal hernia. 3. Aortic Atherosclerosis (ICD10-I70.0). 4. Small hiatal hernia. Electronically Signed   By: LKristine GarbeM.D.   On: 10/22/2018 03:33       Assessment & Plan:  Principal Problem:   SBO (small bowel obstruction) (HCC) Active Problems:   Hypothyroidism   Acute lower UTI   Depression   SBO NPO NGT to low intermittent suction Pain control dilaudid '1mg'$  iv q4h prn severe pain Ns iv at 19m per hour Reviewed her medications, Aimovig / Lamictal/ Gabapentin => constipation, no reports of SBO associated with any of her medications.  Surgery consulted by ED, appreciate input  Acute lower UTI Await urine culture Rocephin 1gm iv qday  Hypothyroidism Cont Levothyroxine  Anxiety/ Depression Cont Gabapentin '100mg'$  po qhs Cont Lamictal '50mg'$  po qday  Hx of migraines Cont Trileptal '150mg'$  po qhs Maxalt '5mg'$  po qday prn  DC Amovig, pt states has not been taking.   DVT Prophylaxis-    SCDs   AM Labs Ordered, also please review Full Orders  Family Communication: Admission, patients condition and plan of care including tests being ordered have been discussed with the patient  who indicate understanding and agree with the plan and Code Status.  Code  Status:   FULL CODE  Admission status: Inpatient: Based on patients clinical presentation and evaluation of above clinical data, I have made determination that patient meets Inpatient criteria at this time.  Pt will require IV fluids and iv pain medication due to SBO, which is significant since it has transition point,  NGT will take more likely than not > 2nites stay to decompress her and if not may require surgery.  Due to the severity of her SBO pt will require inpatient stay  Time spent in minutes : 70   JJani GravelM.D on 10/22/2018 at 4:39 AM

## 2018-10-22 NOTE — Progress Notes (Signed)
Pt reports passing flatus as of 1800 this shift.

## 2018-10-22 NOTE — ED Provider Notes (Signed)
[facility] DEPT Provider Note   CSN: [id number] Arrival date & time: 10/21/18  2333    History   Chief Complaint Chief Complaint  Patient presents with  . Abdominal Pain    HPI Heidi Evans is a 63 y.o. female.     The history is provided by the patient.  Abdominal Pain  Pain location:  Periumbilical Pain quality comment:  "feels like a rock" Pain radiates to:  Does not radiate Onset quality:  Sudden Duration:  1 day Timing:  Constant Progression:  Unchanged Chronicity:  Recurrent Context: previous surgery   Context: not diet changes, not eating and not laxative use   Relieved by:  Nothing Worsened by:  Nothing Ineffective treatments:  None tried Associated symptoms: constipation, nausea and vomiting   Associated symptoms: no chest pain, no cough, no fever, no shortness of breath and no sore throat   Risk factors: no recent hospitalization   Patient with Nissen fundoplication presents with nausea and vomiting and abdominal pain.  No f/c/r.  No urinary complaints.  Feels like she needs to pass stool but can't.  No cough no SOB.  No changes in diet or medication.    Past Medical History:  Diagnosis Date  . Acid reflux   . Anxiety   . Depression   . Hypothyroidism   . Migraine   . Osteoporosis   . Vitamin D deficiency     Patient Active Problem List   Diagnosis Date Noted  . Hypercholesteremia 04/19/2018  . Thyroid disease 04/19/2018  . Daily headache 04/19/2018  . Neck pain on left side 04/19/2018    Past Surgical History:  Procedure Laterality Date  . ABDOMINAL HYSTERECTOMY     total  . BREAST REDUCTION SURGERY  2011   Dr. Towanda Malkin  . NASAL SEPTUM SURGERY  1977  . NISSEN FUNDOPLICATION  7412   Dr. Johney Maine     OB History   No obstetric history on file.      Home Medications    Prior to Admission medications   Medication Sig Start Date End Date Taking? Authorizing Provider  diazepam (VALIUM) 5 MG tablet Take  5 mg by mouth 2 (two) times daily as needed for anxiety.    [provider]  Erenumab-aooe (AIMOVIG) 70 MG/ML SOAJ Inject 70 mg into the skin every 30 (thirty) days. 07/19/18   Suzzanne Cloud, NP  gabapentin (NEURONTIN) 100 MG capsule Take 100 mg by mouth at bedtime.  03/09/18   [provider]  lamoTRIgine (LAMICTAL) 200 MG tablet Take 200-400 mg by mouth daily.  02/25/18   [provider]  levothyroxine (SYNTHROID, LEVOTHROID) 100 MCG tablet Take 100 mcg by mouth daily. Alternate with levothyroxine 88 mcg 03/24/18   [provider]  levothyroxine (SYNTHROID, LEVOTHROID) 88 MCG tablet Take 88 mcg by mouth daily before breakfast. Alternate with levothyroxine 100 mcg    [provider]  Oxcarbazepine (TRILEPTAL) 300 MG tablet Take 300 mg by mouth at bedtime.    [provider]  rizatriptan (MAXALT-MLT) 5 MG disintegrating tablet Take 1 tablet (5 mg total) by mouth as needed. May repeat in 2 hours if needed, no refill in less than 30 days 04/19/18   Marcial Pacas, MD    Family History Family History  Problem Relation Age of Onset  . Migraines Mother   . Colon cancer Father   . Headache Other        brain tumor  . Bipolar disorder Other        "  in my family"  . Mental illness Other        "for different ones"    Social History Social History   Tobacco Use  . Smoking status: Former Smoker    Types: Cigarettes    Last attempt to quit: 1994    Years since quitting: 26.4  . Smokeless tobacco: Never Used  Substance Use Topics  . Alcohol use: Yes    Comment: rare 1-2 times per year  . Drug use: Not Currently    Types: Marijuana    Comment: in the past     Allergies   Codeine   Review of Systems Review of Systems  Constitutional: Negative for appetite change and fever.  HENT: Negative for sore throat.   Respiratory: Negative for cough and shortness of breath.   Cardiovascular: Negative for chest pain.  Gastrointestinal: Positive  for abdominal pain, constipation, nausea and vomiting.  All other systems reviewed and are negative.    Physical Exam Updated Vital Signs BP 117/60   Pulse 64   Temp 97.9 F (36.6 C)   Resp (!) 21   Ht 5\' 2"  (1.575 m)   Wt 56.7 kg   SpO2 100%   BMI 22.86 kg/m   Physical Exam Vitals signs and nursing note reviewed.  Constitutional:      General: She is not in acute distress.    Appearance: She is normal weight.  HENT:     Head: Normocephalic.     Nose: Nose normal.  Eyes:     Conjunctiva/sclera: Conjunctivae normal.     Pupils: Pupils are equal, round, and reactive to light.  Neck:     Musculoskeletal: Normal range of motion and neck supple.  Cardiovascular:     Rate and Rhythm: Normal rate and regular rhythm.     Pulses: Normal pulses.     Heart sounds: Normal heart sounds.  Pulmonary:     Effort: Pulmonary effort is normal.     Breath sounds: Normal breath sounds.  Abdominal:     General: Abdomen is flat. Bowel sounds are normal.     Tenderness: There is no abdominal tenderness. There is no guarding.  Musculoskeletal: Normal range of motion.  Skin:    General: Skin is warm and dry.     Capillary Refill: Capillary refill takes less than 2 seconds.  Neurological:     General: No focal deficit present.     Mental Status: She is alert and oriented to person, place, and time.  Psychiatric:        Mood and Affect: Mood normal.        Behavior: Behavior normal.      ED Treatments / Results  Labs (all labs ordered are listed, but only abnormal results are displayed) Results for orders placed or performed during the hospital encounter of 10/21/18  CBC with Differential/Platelet  Result Value Ref Range   WBC 10.3 4.0 - 10.5 K/uL   RBC 4.40 3.87 - 5.11 MIL/uL   Hemoglobin 13.5 12.0 - 15.0 g/dL   HCT 40.5 36.0 - 46.0 %   MCV 92.0 80.0 - 100.0 fL   MCH 30.7 26.0 - 34.0 pg   MCHC 33.3 30.0 - 36.0 g/dL   RDW 12.4 11.5 - 15.5 %   Platelets 301 150 - 400 K/uL    nRBC 0.0 0.0 - 0.2 %   Neutrophils Relative % 80 %   Neutro Abs 8.3 (H) 1.7 - 7.7 K/uL   Lymphocytes Relative 12 %  Lymphs Abs 1.2 0.7 - 4.0 K/uL   Monocytes Relative 6 %   Monocytes Absolute 0.6 0.1 - 1.0 K/uL   Eosinophils Relative 1 %   Eosinophils Absolute 0.1 0.0 - 0.5 K/uL   Basophils Relative 1 %   Basophils Absolute 0.1 0.0 - 0.1 K/uL   Immature Granulocytes 0 %   Abs Immature Granulocytes 0.02 0.00 - 0.07 K/uL  Comprehensive metabolic panel  Result Value Ref Range   Sodium 136 135 - 145 mmol/L   Potassium 4.9 3.5 - 5.1 mmol/L   Chloride 100 98 - 111 mmol/L   CO2 23 22 - 32 mmol/L   Glucose, Bld 135 (H) 70 - 99 mg/dL   BUN 9 8 - 23 mg/dL   Creatinine, Ser 0.92 0.44 - 1.00 mg/dL   Calcium 9.5 8.9 - 10.3 mg/dL   Total Protein 7.1 6.5 - 8.1 g/dL   Albumin 4.6 3.5 - 5.0 g/dL   AST 32 15 - 41 U/L   ALT 16 0 - 44 U/L   Alkaline Phosphatase 49 38 - 126 U/L   Total Bilirubin 1.3 (H) 0.3 - 1.2 mg/dL   GFR calc non Af Amer >60 >60 mL/min   GFR calc Af Amer >60 >60 mL/min   Anion gap 13 5 - 15  Lipase, blood  Result Value Ref Range   Lipase 36 11 - 51 U/L  Troponin I - ONCE - STAT  Result Value Ref Range   Troponin I <0.03 <0.03 ng/mL  Urinalysis, Routine w reflex microscopic  Result Value Ref Range   Color, Urine YELLOW YELLOW   APPearance CLEAR CLEAR   Specific Gravity, Urine 1.013 1.005 - 1.030   pH 9.0 (H) 5.0 - 8.0   Glucose, UA NEGATIVE NEGATIVE mg/dL   Hgb urine dipstick NEGATIVE NEGATIVE   Bilirubin Urine NEGATIVE NEGATIVE   Ketones, ur 5 (A) NEGATIVE mg/dL   Protein, ur 30 (A) NEGATIVE mg/dL   Nitrite NEGATIVE NEGATIVE   Leukocytes,Ua MODERATE (A) NEGATIVE   RBC / HPF 0-5 0 - 5 RBC/hpf   WBC, UA 21-50 0 - 5 WBC/hpf   Bacteria, UA RARE (A) NONE SEEN   Squamous Epithelial / LPF 0-5 0 - 5   Mucus PRESENT    Amorphous Crystal PRESENT    Ca Oxalate Crys, UA PRESENT    Ct Abdomen Pelvis W Contrast  Result Date: 10/22/2018 CLINICAL DATA:  63 y/o  F;  epigastric pain and emesis. EXAM: CT ABDOMEN AND PELVIS WITH CONTRAST TECHNIQUE: Multidetector CT imaging of the abdomen and pelvis was performed using the standard protocol following bolus administration of intravenous contrast. CONTRAST:  147mL OMNIPAQUE IOHEXOL 300 MG/ML  SOLN COMPARISON:  12/24/2005 CT abdomen and pelvis. FINDINGS: Lower chest: No acute abnormality. Small hiatal hernia. Hepatobiliary: 12 mm cyst in the right lobe of liver. No focal liver abnormality is seen. Status post cholecystectomy. No biliary dilatation. Pancreas: Unremarkable. No pancreatic ductal dilatation or surrounding inflammatory changes. Spleen: Normal in size without focal abnormality. Adrenals/Urinary Tract: Adrenal glands are unremarkable. Multiple renal cysts measuring up to 49 mm in the lower pole of right kidney. Otherwise kidneys are normal, without renal calculi, focal lesion, or hydronephrosis. Bladder is unremarkable. Stomach/Bowel: Distal small bowel obstruction which appears to transition in the left hemipelvis (series 2, image 55 and series 5 images 60-80). No findings of perforation. Normal appearance of the colon and appendix. Stomach is unremarkable. Postsurgical changes at the gastroesophageal junction from prior Nissen fundoplication. Vascular/Lymphatic: Aortic atherosclerosis. No  enlarged abdominal or pelvic lymph nodes. Reproductive: Status post hysterectomy. No adnexal masses. Other: No abdominal wall hernia or abnormality. No abdominopelvic ascites. Musculoskeletal: No fracture is seen. IMPRESSION: 1. Distal small bowel obstruction with transition in the left pelvis, possibly a lesions. No findings of perforation. 2. Small hiatal hernia. 3. Aortic Atherosclerosis (ICD10-I70.0). 4. Small hiatal hernia. Electronically Signed   By: Kristine Garbe M.D.   On: 10/22/2018 03:33    EKG EKG Interpretation  Date/Time:  Friday Oct 22 2018 00:08:05 EDT Ventricular Rate:  62 PR Interval:    QRS  Duration: 95 QT Interval:  474 QTC Calculation: 482 R Axis:   72 Text Interpretation:  Sinus rhythm Confirmed by Randal Buba, Oluwadamilola Rosamond ([id number]) on 10/22/2018 1:02:19 AM   Radiology No results found.  Procedures Procedures (including critical care time)  Medications Ordered in ED Medications  cefTRIAXone (ROCEPHIN) 1 g in sodium chloride 0.9 % 100 mL IVPB (has no administration in time range)  0.9 %  sodium chloride infusion (has no administration in time range)  acetaminophen (TYLENOL) tablet 650 mg (has no administration in time range)    Or  acetaminophen (TYLENOL) suppository 650 mg (has no administration in time range)  HYDROmorphone (DILAUDID) injection 1 mg (has no administration in time range)  ondansetron (ZOFRAN) injection 4 mg (has no administration in time range)  ondansetron (ZOFRAN) injection 4 mg (4 mg Intravenous Given 10/22/18 0033)  sodium chloride 0.9 % bolus 1,000 mL (0 mLs Intravenous Stopped 10/22/18 0200)  ketorolac (TORADOL) 30 MG/ML injection 15 mg (15 mg Intravenous Given 10/22/18 0108)  alum & mag hydroxide-simeth (MAALOX/MYLANTA) 200-200-20 MG/5ML suspension 30 mL (30 mLs Oral Given 10/22/18 0109)    And  lidocaine (XYLOCAINE) 2 % viscous mouth solution 15 mL (15 mLs Oral Given 10/22/18 0109)  sodium chloride (PF) 0.9 % injection (3 mLs  Given 10/22/18 0318)  iohexol (OMNIPAQUE) 300 MG/ML solution 100 mL (100 mLs Intravenous Contrast Given 10/22/18 0252)  cefTRIAXone (ROCEPHIN) 1 g in sodium chloride 0.9 % 100 mL IVPB (0 g Intravenous Stopped 10/22/18 0407)  fentaNYL (SUBLIMAZE) injection 50 mcg (50 mcg Intravenous Given 10/22/18 3559)     Case d/w Dr. Catalina Antigua, admit to medicine, formerly consult ccs to follow.  It is ok to attempt NGT.  If will not advance will need to be done under [facility] in the morning.     Patient has been updated on labs imaging and plan of care.  She is satisfied with her care. And is feeling somewhat improved   Final Clinical Impressions(s) /  ED Diagnoses   Admit to medicine   Denard Tuminello, MD 10/22/18 7416

## 2018-10-22 NOTE — ED Notes (Signed)
ED TO INPATIENT HANDOFF REPORT  Name/Age/Gender Heidi Evans 63 y.o. female  Code Status    Code Status Orders  (From admission, onward)         Start     Ordered   10/22/18 0433  Full code  Continuous     10/22/18 0436        Code Status History    This patient has a current code status but no historical code status.    Advance Directive Documentation     Most Recent Value  Type of Advance Directive  Healthcare Power of Attorney, Living will  Pre-existing out of facility DNR order (yellow form or pink MOST form)  -  "MOST" Form in Place?  -      Home/SNF/Other Home  Chief Complaint Abdominal Pain  Level of Care/Admitting Diagnosis ED Disposition    ED Disposition Condition [place]: [facility] [[id number]]  Level of Care: Telemetry [5]  Admit to tele based on following criteria: Other see comments  Comments: bradycardia  Covid Evaluation: N/A  Diagnosis: SBO (small bowel obstruction) [facility]) [[id number]]  Admitting Physician: Jani Gravel [[id number]]  Attending Physician: Jani Gravel [phone]  Estimated length of stay: past midnight tomorrow  Certification:: I certify this patient will need inpatient services for at least 2 midnights  PT Class (Do Not Modify): Inpatient [101]  PT Acc Code (Do Not Modify): Private [1]       Medical History Past Medical History:  Diagnosis Date  . Acid reflux   . Anxiety   . Depression   . Hypothyroidism   . Migraine   . Osteoporosis   . Vitamin D deficiency     Allergies Allergies  Allergen Reactions  . Codeine Itching  . Other Other (See Comments)    Some anesthesia medications cause her nausea (unknown which)    IV Location/Drains/Wounds Patient Lines/Drains/Airways Status   Active Line/Drains/Airways    Name:   Placement date:   Placement time:   Site:   Days:   Peripheral IV 10/22/18 Left Hand   10/22/18    0332    Hand   less than 1   NG/OG Tube Nasogastric 14 Fr. Left  nare Xray Measured external length of tube 22 cm 22 cm   10/22/18    0427    Left nare   less than 1          Labs/Imaging Results for orders placed or performed during the hospital encounter of 10/21/18 (from the past 48 hour(s))  CBC with Differential/Platelet     Status: Abnormal   Collection Time: 10/22/18 12:20 AM  Result Value Ref Range   WBC 10.3 4.0 - 10.5 K/uL   RBC 4.40 3.87 - 5.11 MIL/uL   Hemoglobin 13.5 12.0 - 15.0 g/dL   HCT 40.5 36.0 - 46.0 %   MCV 92.0 80.0 - 100.0 fL   MCH 30.7 26.0 - 34.0 pg   MCHC 33.3 30.0 - 36.0 g/dL   RDW 12.4 11.5 - 15.5 %   Platelets 301 150 - 400 K/uL   nRBC 0.0 0.0 - 0.2 %   Neutrophils Relative % 80 %   Neutro Abs 8.3 (H) 1.7 - 7.7 K/uL   Lymphocytes Relative 12 %   Lymphs Abs 1.2 0.7 - 4.0 K/uL   Monocytes Relative 6 %   Monocytes Absolute 0.6 0.1 - 1.0 K/uL   Eosinophils Relative 1 %   Eosinophils Absolute 0.1 0.0 -  0.5 K/uL   Basophils Relative 1 %   Basophils Absolute 0.1 0.0 - 0.1 K/uL   Immature Granulocytes 0 %   Abs Immature Granulocytes 0.02 0.00 - 0.07 K/uL    Comment: Performed at [facility], [place] [address] [place], [place] [address]  Comprehensive metabolic panel     Status: Abnormal   Collection Time: 10/22/18 12:20 AM  Result Value Ref Range   Sodium 136 135 - 145 mmol/L   Potassium 4.9 3.5 - 5.1 mmol/L   Chloride 100 98 - 111 mmol/L   CO2 23 22 - 32 mmol/L   Glucose, Bld 135 (H) 70 - 99 mg/dL   BUN 9 8 - 23 mg/dL   Creatinine, Ser 0.92 0.44 - 1.00 mg/dL   Calcium 9.5 8.9 - 10.3 mg/dL   Total Protein 7.1 6.5 - 8.1 g/dL   Albumin 4.6 3.5 - 5.0 g/dL   AST 32 15 - 41 U/L   ALT 16 0 - 44 U/L   Alkaline Phosphatase 49 38 - 126 U/L   Total Bilirubin 1.3 (H) 0.3 - 1.2 mg/dL   GFR calc non Af Amer >60 >60 mL/min   GFR calc Af Amer >60 >60 mL/min   Anion gap 13 5 - 15    Comment: Performed at [facility], [place] [address], [place], [place] [address]  Lipase, blood      Status: None   Collection Time: 10/22/18 12:20 AM  Result Value Ref Range   Lipase 36 11 - 51 U/L    Comment: Performed at [facility], [place] [address] [place], [place] [address]  Troponin I - ONCE - STAT     Status: None   Collection Time: 10/22/18 12:20 AM  Result Value Ref Range   Troponin I <0.03 <0.03 ng/mL    Comment: Performed at [facility] [place] [address] [place], [place] [address]  Urinalysis, Routine w reflex microscopic     Status: Abnormal   Collection Time: 10/22/18 12:34 AM  Result Value Ref Range   Color, Urine YELLOW YELLOW   APPearance CLEAR CLEAR   Specific Gravity, Urine 1.013 1.005 - 1.030   pH 9.0 (H) 5.0 - 8.0   Glucose, UA NEGATIVE NEGATIVE mg/dL   Hgb urine dipstick NEGATIVE NEGATIVE   Bilirubin Urine NEGATIVE NEGATIVE   Ketones, ur 5 (A) NEGATIVE mg/dL   Protein, ur 30 (A) NEGATIVE mg/dL   Nitrite NEGATIVE NEGATIVE   Leukocytes,Ua MODERATE (A) NEGATIVE   RBC / HPF 0-5 0 - 5 RBC/hpf   WBC, UA 21-50 0 - 5 WBC/hpf   Bacteria, UA RARE (A) NONE SEEN   Squamous Epithelial / LPF 0-5 0 - 5   Mucus PRESENT    Amorphous Crystal PRESENT    Ca Oxalate Crys, UA PRESENT     Comment: Performed at [facility], [place] [address] [place], [place] [address]  Protime-INR     Status: None   Collection Time: 10/22/18  4:29 AM  Result Value Ref Range   Prothrombin Time 14.3 11.4 - 15.2 seconds   INR 1.1 0.8 - 1.2    Comment: (NOTE) INR goal varies based on device and disease states. Performed at [facility], [place] [address], [place], [place] [address]   APTT     Status: None   Collection Time: 10/22/18  4:29 AM  Result Value Ref Range   aPTT 28 24 - 36 seconds  Comment: Performed at [facility], [place] [address] [place], [place] [address]   Dg Abdomen 1 View  Result Date: 10/22/2018 CLINICAL DATA:  Nasogastric tube placement EXAM: ABDOMEN - 1 VIEW COMPARISON:   Abdominal CT from earlier today FINDINGS: Nasogastric tube tip is in the proximal stomach. Fluid-filled small bowel that is underestimated relative to prior CT. Full collecting system on both sides in the setting of a full urinary bladder. Cholecystectomy. IMPRESSION: 1. The nasogastric tube reaches the stomach. 2. Full upper tract collecting system likely from the distended urinary bladder. Electronically Signed   By: Monte Fantasia M.D.   On: 10/22/2018 04:46   Ct Abdomen Pelvis W Contrast  Result Date: 10/22/2018 CLINICAL DATA:  63 y/o  F; epigastric pain and emesis. EXAM: CT ABDOMEN AND PELVIS WITH CONTRAST TECHNIQUE: Multidetector CT imaging of the abdomen and pelvis was performed using the standard protocol following bolus administration of intravenous contrast. CONTRAST:  132mL OMNIPAQUE IOHEXOL 300 MG/ML  SOLN COMPARISON:  12/24/2005 CT abdomen and pelvis. FINDINGS: Lower chest: No acute abnormality. Small hiatal hernia. Hepatobiliary: 12 mm cyst in the right lobe of liver. No focal liver abnormality is seen. Status post cholecystectomy. No biliary dilatation. Pancreas: Unremarkable. No pancreatic ductal dilatation or surrounding inflammatory changes. Spleen: Normal in size without focal abnormality. Adrenals/Urinary Tract: Adrenal glands are unremarkable. Multiple renal cysts measuring up to 49 mm in the lower pole of right kidney. Otherwise kidneys are normal, without renal calculi, focal lesion, or hydronephrosis. Bladder is unremarkable. Stomach/Bowel: Distal small bowel obstruction which appears to transition in the left hemipelvis (series 2, image 55 and series 5 images 60-80). No findings of perforation. Normal appearance of the colon and appendix. Stomach is unremarkable. Postsurgical changes at the gastroesophageal junction from prior Nissen fundoplication. Vascular/Lymphatic: Aortic atherosclerosis. No enlarged abdominal or pelvic lymph nodes. Reproductive: Status post hysterectomy. No adnexal  masses. Other: No abdominal wall hernia or abnormality. No abdominopelvic ascites. Musculoskeletal: No fracture is seen. IMPRESSION: 1. Distal small bowel obstruction with transition in the left pelvis, possibly a lesions. No findings of perforation. 2. Small hiatal hernia. 3. Aortic Atherosclerosis (ICD10-I70.0). 4. Small hiatal hernia. Electronically Signed   By: Kristine Garbe M.D.   On: 10/22/2018 03:33    Pending Labs Unresulted Labs (From admission, onward)    Start     Ordered   10/23/18 0500  Comprehensive metabolic panel  Tomorrow morning,   R     10/22/18 0436   10/23/18 0500  CBC  Tomorrow morning,   R     10/22/18 0436   10/22/18 0501  SARS Coronavirus 2 (CEPHEID - Performed in [facility] hospital lab), Hosp Order  (Asymptomatic Patients Labs)  Once,   R    Question:  Rule Out  Answer:  Yes   10/22/18 0500   10/22/18 0429  HIV antibody (Routine Testing)  Once,   R     10/22/18 0436          Vitals/Pain Today's Vitals   10/22/18 0407 10/22/18 0430 10/22/18 0500 10/22/18 0530  BP:  106/68 (!) 115/56 112/60  Pulse:   (!) 58   Resp:  19 15 15   Temp:      SpO2:   98% 98%  Weight:      Height:      PainSc: 3        Isolation Precautions No active isolations  Medications Medications  cefTRIAXone (ROCEPHIN) 1 g in sodium chloride 0.9 % 100 mL IVPB (  has no administration in time range)  0.9 %  sodium chloride infusion ( Intravenous New Bag/Given 10/22/18 0459)  acetaminophen (TYLENOL) tablet 650 mg (has no administration in time range)    Or  acetaminophen (TYLENOL) suppository 650 mg (has no administration in time range)  HYDROmorphone (DILAUDID) injection 1 mg (has no administration in time range)  ondansetron (ZOFRAN) injection 4 mg (has no administration in time range)  ondansetron (ZOFRAN) injection 4 mg (4 mg Intravenous Given 10/22/18 0033)  sodium chloride 0.9 % bolus 1,000 mL (0 mLs Intravenous Stopped 10/22/18 0200)  ketorolac (TORADOL) 30 MG/ML  injection 15 mg (15 mg Intravenous Given 10/22/18 0108)  alum & mag hydroxide-simeth (MAALOX/MYLANTA) 200-200-20 MG/5ML suspension 30 mL (30 mLs Oral Given 10/22/18 0109)    And  lidocaine (XYLOCAINE) 2 % viscous mouth solution 15 mL (15 mLs Oral Given 10/22/18 0109)  sodium chloride (PF) 0.9 % injection (3 mLs  Given 10/22/18 0318)  iohexol (OMNIPAQUE) 300 MG/ML solution 100 mL (100 mLs Intravenous Contrast Given 10/22/18 0252)  cefTRIAXone (ROCEPHIN) 1 g in sodium chloride 0.9 % 100 mL IVPB (0 g Intravenous Stopped 10/22/18 0407)  fentaNYL (SUBLIMAZE) injection 50 mcg (50 mcg Intravenous Given 10/22/18 0333)    Mobility walks

## 2018-10-22 NOTE — ED Notes (Signed)
Pt returned to bed.

## 2018-10-22 NOTE — ED Notes (Signed)
Pt remains sitting on bed side commode, states that it is more comfortable to sit there, rather than lying in the bed.

## 2018-10-22 NOTE — ED Notes (Signed)
Pt transported to CT ?

## 2018-10-23 ENCOUNTER — Inpatient Hospital Stay (HOSPITAL_COMMUNITY): Payer: Medicare Other

## 2018-10-23 LAB — COMPREHENSIVE METABOLIC PANEL
ALT: 12 U/L (ref 0–44)
AST: 16 U/L (ref 15–41)
Albumin: 3.2 g/dL — ABNORMAL LOW (ref 3.5–5.0)
Alkaline Phosphatase: 32 U/L — ABNORMAL LOW (ref 38–126)
Anion gap: 6 (ref 5–15)
BUN: 8 mg/dL (ref 8–23)
CO2: 22 mmol/L (ref 22–32)
Calcium: 7.8 mg/dL — ABNORMAL LOW (ref 8.9–10.3)
Chloride: 112 mmol/L — ABNORMAL HIGH (ref 98–111)
Creatinine, Ser: 0.6 mg/dL (ref 0.44–1.00)
GFR calc Af Amer: >60 mL/min (ref >60)
GFR calc non Af Amer: >60 mL/min (ref >60)
Glucose, Bld: 91 mg/dL (ref 70–99)
Potassium: 3.1 mmol/L — ABNORMAL LOW (ref 3.5–5.1)
Sodium: 140 mmol/L (ref 135–145)
Total Bilirubin: 0.8 mg/dL (ref 0.3–1.2)
Total Protein: 5 g/dL — ABNORMAL LOW (ref 6.5–8.1)

## 2018-10-23 LAB — CBC
HCT: 37.1 % (ref 36.0–46.0)
Hemoglobin: 11.9 g/dL — ABNORMAL LOW (ref 12.0–15.0)
MCH: 30.9 pg (ref 26.0–34.0)
MCHC: 32.1 g/dL (ref 30.0–36.0)
MCV: 96.4 fL (ref 80.0–100.0)
Platelets: 278 10*3/uL (ref 150–400)
RBC: 3.85 MIL/uL — ABNORMAL LOW (ref 3.87–5.11)
RDW: 13.4 % (ref 11.5–15.5)
WBC: 8.5 10*3/uL (ref 4.0–10.5)
nRBC: 0 % (ref 0.0–0.2)

## 2018-10-23 LAB — MAGNESIUM: Magnesium: 2.4 mg/dL (ref 1.7–2.4)

## 2018-10-23 MED ORDER — MORPHINE SULFATE (PF) 2 MG/ML IV SOLN
2.0000 mg | INTRAVENOUS | Status: DC | PRN
  Administered 2018-10-23 – 2018-10-24 (×5): 2 mg via INTRAVENOUS
  Filled 2018-10-23 (×5): qty 1

## 2018-10-23 MED ORDER — DIATRIZOATE MEGLUMINE & SODIUM 66-10 % PO SOLN
90.0000 mL | Freq: Once | ORAL | Status: AC
  Administered 2018-10-23: 90 mL via NASOGASTRIC
  Filled 2018-10-23: qty 90

## 2018-10-23 MED ORDER — SODIUM CHLORIDE 0.9 % IV SOLN
INTRAVENOUS | Status: DC
  Administered 2018-10-23 – 2018-10-24 (×3): via INTRAVENOUS

## 2018-10-23 MED ORDER — KETOROLAC TROMETHAMINE 30 MG/ML IJ SOLN: 30.0000 mg | Freq: Four times a day (QID) | INTRAMUSCULAR | Status: DC | PRN

## 2018-10-23 MED ORDER — KETOROLAC TROMETHAMINE 30 MG/ML IJ SOLN
30.0000 mg | Freq: Four times a day (QID) | INTRAMUSCULAR | Status: DC | PRN
  Administered 2018-10-23: 30 mg via INTRAVENOUS
  Filled 2018-10-23: qty 1

## 2018-10-23 MED ORDER — POTASSIUM CHLORIDE 10 MEQ/100ML IV SOLN
10.0000 meq | INTRAVENOUS | Status: AC
  Administered 2018-10-23 (×5): 10 meq via INTRAVENOUS
  Filled 2018-10-23 (×5): qty 100

## 2018-10-23 NOTE — Consult Note (Signed)
Reason for Consult:N/V, abd pain Referring Physician: Dr Landis Gandy Heidi Evans is an 63 y.o. female.  HPI:  Pt s/p nissen fundoplication for Gerd, abdominal hysterectomy, presents with c/o abdominal discomfort, epigastric, area, as well as emesis last night.  Pt has had this pain before, but states typically her it disappears after an hour or 2 but this time persisted and therefore presented to ED.  Pt denies fever, chills, cp, palp, cough, hematemesis, diarrhea, constipation, brbpr.     Past Medical History:  Diagnosis Date  . Acid reflux   . Anxiety   . Depression   . Hypothyroidism   . Migraine   . Osteoporosis   . Vitamin D deficiency     Past Surgical History:  Procedure Laterality Date  . ABDOMINAL HYSTERECTOMY     total  . BREAST REDUCTION SURGERY  2011   Dr. Towanda Malkin  . NASAL SEPTUM SURGERY  1977  . NISSEN FUNDOPLICATION  [id number]   Dr. Johney Maine    Family History  Problem Relation Age of Onset  . Migraines Mother   . Colon cancer Father   . Headache Other        brain tumor  . Bipolar disorder Other        "in my family"  . Mental illness Other        "for different ones"    Social History:  reports that she quit smoking about 26 years ago. Her smoking use included cigarettes. She has never used smokeless tobacco. She reports current alcohol use. She reports previous drug use. Drug: Marijuana.  Allergies:  Allergies  Allergen Reactions  . Codeine Itching  . Other Other (See Comments)    Some anesthesia medications cause her nausea (unknown which)    Medications: I have reviewed the patient's current medications.  Results for orders placed or performed during the hospital encounter of 10/21/18 (from the past 48 hour(s))  CBC with Differential/Platelet     Status: Abnormal   Collection Time: 10/22/18 12:20 AM  Result Value Ref Range   WBC 10.3 4.0 - 10.5 K/uL   RBC 4.40 3.87 - 5.11 MIL/uL   Hemoglobin 13.5 12.0 - 15.0 g/dL   HCT 40.5 36.0 - 46.0 %    MCV 92.0 80.0 - 100.0 fL   MCH 30.7 26.0 - 34.0 pg   MCHC 33.3 30.0 - 36.0 g/dL   RDW 12.4 11.5 - 15.5 %   Platelets 301 150 - 400 K/uL   nRBC 0.0 0.0 - 0.2 %   Neutrophils Relative % 80 %   Neutro Abs 8.3 (H) 1.7 - 7.7 K/uL   Lymphocytes Relative 12 %   Lymphs Abs 1.2 0.7 - 4.0 K/uL   Monocytes Relative 6 %   Monocytes Absolute 0.6 0.1 - 1.0 K/uL   Eosinophils Relative 1 %   Eosinophils Absolute 0.1 0.0 - 0.5 K/uL   Basophils Relative 1 %   Basophils Absolute 0.1 0.0 - 0.1 K/uL   Immature Granulocytes 0 %   Abs Immature Granulocytes 0.02 0.00 - 0.07 K/uL    Comment: Performed at [facility], [place] [address] [place], [place] [address]  Comprehensive metabolic panel     Status: Abnormal   Collection Time: 10/22/18 12:20 AM  Result Value Ref Range   Sodium 136 135 - 145 mmol/L   Potassium 4.9 3.5 - 5.1 mmol/L   Chloride 100 98 - 111 mmol/L   CO2 23 22 - 32 mmol/L   Glucose, Bld  135 (H) 70 - 99 mg/dL   BUN 9 8 - 23 mg/dL   Creatinine, Ser 0.92 0.44 - 1.00 mg/dL   Calcium 9.5 8.9 - 10.3 mg/dL   Total Protein 7.1 6.5 - 8.1 g/dL   Albumin 4.6 3.5 - 5.0 g/dL   AST 32 15 - 41 U/L   ALT 16 0 - 44 U/L   Alkaline Phosphatase 49 38 - 126 U/L   Total Bilirubin 1.3 (H) 0.3 - 1.2 mg/dL   GFR calc non Af Amer >60 >60 mL/min   GFR calc Af Amer >60 >60 mL/min   Anion gap 13 5 - 15    Comment: Performed at [facility], [place] [address], [place], [place] [address]  Lipase, blood     Status: None   Collection Time: 10/22/18 12:20 AM  Result Value Ref Range   Lipase 36 11 - 51 U/L    Comment: Performed at [facility], [place] [address], [place], [place] [address]  Troponin I - ONCE - STAT     Status: None   Collection Time: 10/22/18 12:20 AM  Result Value Ref Range   Troponin I <0.03 <0.03 ng/mL    Comment: Performed at [facility], [place] [address] [place], [place] [address]  Urinalysis, Routine w reflex  microscopic     Status: Abnormal   Collection Time: 10/22/18 12:34 AM  Result Value Ref Range   Color, Urine YELLOW YELLOW   APPearance CLEAR CLEAR   Specific Gravity, Urine 1.013 1.005 - 1.030   pH 9.0 (H) 5.0 - 8.0   Glucose, UA NEGATIVE NEGATIVE mg/dL   Hgb urine dipstick NEGATIVE NEGATIVE   Bilirubin Urine NEGATIVE NEGATIVE   Ketones, ur 5 (A) NEGATIVE mg/dL   Protein, ur 30 (A) NEGATIVE mg/dL   Nitrite NEGATIVE NEGATIVE   Leukocytes,Ua MODERATE (A) NEGATIVE   RBC / HPF 0-5 0 - 5 RBC/hpf   WBC, UA 21-50 0 - 5 WBC/hpf   Bacteria, UA RARE (A) NONE SEEN   Squamous Epithelial / LPF 0-5 0 - 5   Mucus PRESENT    Amorphous Crystal PRESENT    Ca Oxalate Crys, UA PRESENT     Comment: Performed at [facility], [place] [address], [place], [place] [address]  HIV antibody (Routine Testing)     Status: None   Collection Time: 10/22/18  4:29 AM  Result Value Ref Range   HIV Screen 4th Generation wRfx Non Reactive Non Reactive    Comment: (NOTE) Performed At: [facility] [place], [place] [id number] Rush Farmer MD VO:[id number]   Protime-INR     Status: None   Collection Time: 10/22/18  4:29 AM  Result Value Ref Range   Prothrombin Time 14.3 11.4 - 15.2 seconds   INR 1.1 0.8 - 1.2    Comment: (NOTE) INR goal varies based on device and disease states. Performed at [facility], [place] [address], [place], [place] [address]   APTT     Status: None   Collection Time: 10/22/18  4:29 AM  Result Value Ref Range   aPTT 28 24 - 36 seconds    Comment: Performed at [facility], [place] [address] [place],  [address]  SARS Coronavirus 2 (CEPHEID - Performed in [facility] hospital lab), Hosp Order     Status: None   Collection Time: 10/22/18  4:50 AM  Result Value Ref Range   SARS Coronavirus 2  NEGATIVE NEGATIVE    Comment: (NOTE) If result is NEGATIVE SARS-CoV-2 target nucleic acids are NOT DETECTED. The  SARS-CoV-2 RNA is generally detectable in upper and lower  respiratory specimens during the acute phase of infection. The lowest  concentration of SARS-CoV-2 viral copies this assay can detect is 250  copies / mL. A negative result does not preclude SARS-CoV-2 infection  and should not be used as the sole basis for treatment or other  patient management decisions.  A negative result may occur with  improper specimen collection / handling, submission of specimen other  than nasopharyngeal swab, presence of viral mutation(s) within the  areas targeted by this assay, and inadequate number of viral copies  (<250 copies / mL). A negative result must be combined with clinical  observations, patient history, and epidemiological information. If result is POSITIVE SARS-CoV-2 target nucleic acids are DETECTED. The SARS-CoV-2 RNA is generally detectable in upper and lower  respiratory specimens dur ing the acute phase of infection.  Positive  results are indicative of active infection with SARS-CoV-2.  Clinical  correlation with patient history and other diagnostic information is  necessary to determine patient infection status.  Positive results do  not rule out bacterial infection or co-infection with other viruses. If result is PRESUMPTIVE POSTIVE SARS-CoV-2 nucleic acids MAY BE PRESENT.   A presumptive positive result was obtained on the submitted specimen  and confirmed on repeat testing.  While 2019 novel coronavirus  (SARS-CoV-2) nucleic acids may be present in the submitted sample  additional confirmatory testing may be necessary for epidemiological  and / or clinical management purposes  to differentiate between  SARS-CoV-2 and other Sarbecovirus currently known to infect humans.  If clinically indicated additional testing with an alternate test  methodology [phone]) is advised. The SARS-CoV-2 RNA is generally  detectable in upper and lower respiratory sp ecimens during the acute   phase of infection. The expected result is Negative. Fact Sheet for Patients:  StrictlyIdeas.no Fact Sheet for Healthcare Providers: BankingDealers.co.za This test is not yet approved or cleared by the [place] FDA and has been authorized for detection and/or diagnosis of SARS-CoV-2 by FDA under an Emergency Use Authorization (EUA).  This EUA will remain in effect (meaning this test can be used) for the duration of the COVID-19 declaration under Section 564(b)(1) of the Act, 21 U.S.C. section 360bbb-3(b)(1), unless the authorization is terminated or revoked sooner. Performed at [facility], [place] [address], [place], [place] [address]   Comprehensive metabolic panel     Status: Abnormal   Collection Time: 10/23/18  4:49 AM  Result Value Ref Range   Sodium 140 135 - 145 mmol/L   Potassium 3.1 (L) 3.5 - 5.1 mmol/L    Comment: DELTA CHECK NOTED   Chloride 112 (H) 98 - 111 mmol/L   CO2 22 22 - 32 mmol/L   Glucose, Bld 91 70 - 99 mg/dL   BUN 8 8 - 23 mg/dL   Creatinine, Ser 0.60 0.44 - 1.00 mg/dL   Calcium 7.8 (L) 8.9 - 10.3 mg/dL   Total Protein 5.0 (L) 6.5 - 8.1 g/dL   Albumin 3.2 (L) 3.5 - 5.0 g/dL   AST 16 15 - 41 U/L   ALT 12 0 - 44 U/L   Alkaline Phosphatase 32 (L) 38 - 126 U/L   Total Bilirubin 0.8 0.3 - 1.2 mg/dL   GFR calc non Af Amer >60 >60 mL/min   GFR calc Af Amer >60 >60 mL/min  Anion gap 6 5 - 15    Comment: Performed at [facility], [place] [address] [place], [place] [address]  CBC     Status: Abnormal   Collection Time: 10/23/18  4:49 AM  Result Value Ref Range   WBC 8.5 4.0 - 10.5 K/uL   RBC 3.85 (L) 3.87 - 5.11 MIL/uL   Hemoglobin 11.9 (L) 12.0 - 15.0 g/dL   HCT 37.1 36.0 - 46.0 %   MCV 96.4 80.0 - 100.0 fL   MCH 30.9 26.0 - 34.0 pg   MCHC 32.1 30.0 - 36.0 g/dL   RDW 13.4 11.5 - 15.5 %   Platelets 278 150 - 400 K/uL   nRBC 0.0 0.0 - 0.2 %    Comment: Performed at  [facility], [place] [address] [place], [place] [address]  Magnesium     Status: None   Collection Time: 10/23/18  9:29 AM  Result Value Ref Range   Magnesium 2.4 1.7 - 2.4 mg/dL    Comment: Performed at [facility], [place] [address], [place], [place] [address]    Dg Abdomen 1 View  Result Date: 10/22/2018 CLINICAL DATA:  Nasogastric tube placement EXAM: ABDOMEN - 1 VIEW COMPARISON:  Abdominal CT from earlier today FINDINGS: Nasogastric tube tip is in the proximal stomach. Fluid-filled small bowel that is underestimated relative to prior CT. Full collecting system on both sides in the setting of a full urinary bladder. Cholecystectomy. IMPRESSION: 1. The nasogastric tube reaches the stomach. 2. Full upper tract collecting system likely from the distended urinary bladder. Electronically Signed   By: Monte Fantasia M.D.   On: 10/22/2018 04:46   Ct Abdomen Pelvis W Contrast  Result Date: 10/22/2018 CLINICAL DATA:  63 y/o  F; epigastric pain and emesis. EXAM: CT ABDOMEN AND PELVIS WITH CONTRAST TECHNIQUE: Multidetector CT imaging of the abdomen and pelvis was performed using the standard protocol following bolus administration of intravenous contrast. CONTRAST:  149mL OMNIPAQUE IOHEXOL 300 MG/ML  SOLN COMPARISON:  12/24/2005 CT abdomen and pelvis. FINDINGS: Lower chest: No acute abnormality. Small hiatal hernia. Hepatobiliary: 12 mm cyst in the right lobe of liver. No focal liver abnormality is seen. Status post cholecystectomy. No biliary dilatation. Pancreas: Unremarkable. No pancreatic ductal dilatation or surrounding inflammatory changes. Spleen: Normal in size without focal abnormality. Adrenals/Urinary Tract: Adrenal glands are unremarkable. Multiple renal cysts measuring up to 49 mm in the lower pole of right kidney. Otherwise kidneys are normal, without renal calculi, focal lesion, or hydronephrosis. Bladder is unremarkable. Stomach/Bowel: Distal small bowel  obstruction which appears to transition in the left hemipelvis (series 2, image 55 and series 5 images 60-80). No findings of perforation. Normal appearance of the colon and appendix. Stomach is unremarkable. Postsurgical changes at the gastroesophageal junction from prior Nissen fundoplication. Vascular/Lymphatic: Aortic atherosclerosis. No enlarged abdominal or pelvic lymph nodes. Reproductive: Status post hysterectomy. No adnexal masses. Other: No abdominal wall hernia or abnormality. No abdominopelvic ascites. Musculoskeletal: No fracture is seen. IMPRESSION: 1. Distal small bowel obstruction with transition in the left pelvis, possibly a lesions. No findings of perforation. 2. Small hiatal hernia. 3. Aortic Atherosclerosis (ICD10-I70.0). 4. Small hiatal hernia. Electronically Signed   By: Kristine Garbe M.D.   On: 10/22/2018 03:33    Review of Systems  Constitutional: Negative for chills and fever.  HENT: Negative for hearing loss.   Eyes: Negative for blurred vision.  Respiratory: Negative for cough and shortness of breath.   Cardiovascular: Negative for  chest pain and palpitations.  Gastrointestinal: Positive for abdominal pain, nausea and vomiting.  Genitourinary: Negative for dysuria, frequency and urgency.  Skin: Negative for itching and rash.  Neurological: Negative for dizziness and headaches.   Blood pressure 122/72, pulse (!) 58, temperature 98.6 F (37 C), temperature source Oral, resp. rate 14, height 5\' 2"  (1.575 m), weight 58.6 kg, SpO2 99 %. Physical Exam  Constitutional: She is oriented to person, place, and time. She appears well-developed and well-nourished. No distress.  HENT:  Head: Normocephalic and atraumatic.  Eyes: Pupils are equal, round, and reactive to light. Conjunctivae are normal.  Neck: Normal range of motion. Neck supple.  Cardiovascular: Normal rate and regular rhythm.  Respiratory: Effort normal. No respiratory distress.  GI: Soft. She exhibits  distension.  Musculoskeletal: Normal range of motion.  Neurological: She is alert and oriented to person, place, and time.    Assessment/Plan: Cont NG Strict I/O's: record amount of NG and UOP Ambulate TID Start SBO protocol  Replace electrolytes  Rosario Adie 07/28/3333, 12:57 PM

## 2018-10-23 NOTE — Progress Notes (Signed)
PROGRESS NOTE    JERRA HUCKEBY  HWE:[id number] DOB: 1955/06/11 DOA: 10/21/2018 PCP: Sherald Hess., MD   Brief Narrative: Patient is a 63 year old female with history of Nissen fundoplication, GERD, abdominal hysterectomy, hypothyroidism, depression, migraines who presented to the emergency department with complaints of abdominal pain, nausea and vomiting. CT abdomen/pelvis showed distal small bowel obstruction with transition in the left pelvis.  No perforation.  Started on conservative management.NG tube placed. This morning NG tube still draining bilious fluid.  Assessment & Plan:   Principal Problem:   SBO (small bowel obstruction) (HCC) Active Problems:   Hypothyroidism   Acute lower UTI   Depression   SBO: Continue n.p.o. status.  NG tube to low intermittent suction.  NG tube still draining bilious fluid this morning.  Abdomen pain has improved.  Continue gentle IV fluids.  Continue pain medication, antiemetics.Passing flatus.  Suspected urinary tract infection: No history of dysuria.  Patient is afebrile.  White cell count stable.  There are few white cells in urine.  Urine culture sent.  Will discontinue antibiotics.  Hypothyroidism: Continue IV Synthyroid for now  Anxiety/depression: Continue Ativan as needed IV.  History of migraines, headache: On migraine medications at home.  Continue ketorolac for now.  Hypokalemia: Being supplemented with potassium.         DVT prophylaxis:ScD Code Status: Full Family Communication: None present at the bedside Disposition Plan: After resolution of small bowel obstruction   Consultants: General surgery  Procedures: None  Antimicrobials:  Anti-infectives (From admission, onward)   Start     Dose/Rate Route Frequency Ordered Stop   10/23/18 2200  cefTRIAXone (ROCEPHIN) 1 g in sodium chloride 0.9 % 100 mL IVPB     1 g 200 mL/hr over 30 Minutes Intravenous Every 24 hours 10/22/18 0436     10/22/18 0315   cefTRIAXone (ROCEPHIN) 1 g in sodium chloride 0.9 % 100 mL IVPB     1 g 200 mL/hr over 30 Minutes Intravenous  Once 10/22/18 0306 10/22/18 0407      Subjective: Patient seen and examined the bedside this morning.  Hemodynamically stable.  Complains of severe headache this morning.  NG tube still draining bilious fluid.  Passing flatus.  Objective: Vitals:   10/22/18 0640 10/22/18 1309 10/22/18 2206 10/23/18 0630  BP: 104/63  102/90 122/72  Pulse: (!) 55 (!) 53 (!) 50 (!) 58  Resp: 16 16 16 14   Temp: 98.3 F (36.8 C) 98.7 F (37.1 C) 98.6 F (37 C) 98.6 F (37 C)  TempSrc: Oral Oral Oral Oral  SpO2: 100% 100% 100% 99%  Weight: 58.6 kg     Height: 5\' 2"  (1.575 m)       Intake/Output Summary (Last 24 hours) at 10/23/2018 1245 Last data filed at 10/23/2018 0600 Gross per 24 hour  Intake 2439.9 ml  Output 200 ml  Net 2239.9 ml   Filed Weights   10/21/18 2338 10/22/18 0640  Weight: 56.7 kg 58.6 kg    Examination:  General exam: Appears calm and comfortable ,Not in distress,average built HEENT:PERRL,Oral mucosa moist, Ear/Nose normal on gross exam,NG tube  Respiratory system: Bilateral equal air entry, normal vesicular breath sounds, no wheezes or crackles  Cardiovascular system: S1 & S2 heard, RRR. No JVD, murmurs, rubs, gallops or clicks. No pedal edema. Gastrointestinal system: Abdomen is nondistended, soft and nontender. No organomegaly or masses felt. Normal bowel sounds heard. Central nervous system: Alert and oriented. No focal neurological deficits. Extremities: No edema, no clubbing ,  no cyanosis, distal peripheral pulses palpable. Skin: No rashes, lesions or ulcers,no icterus ,no pallor MSK: Normal muscle bulk,tone ,power Psychiatry: Judgement and insight appear normal. Mood & affect appropriate.     Data Reviewed: I have personally reviewed following labs and imaging studies  CBC: Recent Labs  Lab 10/22/18 0020 10/23/18 0449  WBC 10.3 8.5  NEUTROABS 8.3*   --   HGB 13.5 11.9*  HCT 40.5 37.1  MCV 92.0 96.4  PLT 301 329   Basic Metabolic Panel: Recent Labs  Lab 10/22/18 0020 10/23/18 0449 10/23/18 0929  NA 136 140  --   K 4.9 3.1*  --   CL 100 112*  --   CO2 23 22  --   GLUCOSE 135* 91  --   BUN 9 8  --   CREATININE 0.92 0.60  --   CALCIUM 9.5 7.8*  --   MG  --   --  2.4   GFR: Estimated Creatinine Clearance: 56.9 mL/min (by C-G formula based on SCr of 0.6 mg/dL). Liver Function Tests: Recent Labs  Lab 10/22/18 0020 10/23/18 0449  AST 32 16  ALT 16 12  ALKPHOS 49 32*  BILITOT 1.3* 0.8  PROT 7.1 5.0*  ALBUMIN 4.6 3.2*   Recent Labs  Lab 10/22/18 0020  LIPASE 36   No results for input(s): AMMONIA in the last 168 hours. Coagulation Profile: Recent Labs  Lab 10/22/18 0429  INR 1.1   Cardiac Enzymes: Recent Labs  Lab 10/22/18 0020  TROPONINI <0.03   BNP (last 3 results) No results for input(s): PROBNP in the last 8760 hours. HbA1C: No results for input(s): HGBA1C in the last 72 hours. CBG: No results for input(s): GLUCAP in the last 168 hours. Lipid Profile: No results for input(s): CHOL, HDL, LDLCALC, TRIG, CHOLHDL, LDLDIRECT in the last 72 hours. Thyroid Function Tests: No results for input(s): TSH, T4TOTAL, FREET4, T3FREE, THYROIDAB in the last 72 hours. Anemia Panel: No results for input(s): VITAMINB12, FOLATE, FERRITIN, TIBC, IRON, RETICCTPCT in the last 72 hours. Sepsis Labs: No results for input(s): PROCALCITON, LATICACIDVEN in the last 168 hours.  Recent Results (from the past 240 hour(s))  SARS Coronavirus 2 (CEPHEID - Performed in [place] hospital lab), Hosp Order     Status: None   Collection Time: 10/22/18  4:50 AM  Result Value Ref Range Status   SARS Coronavirus 2 NEGATIVE NEGATIVE Final    Comment: (NOTE) If result is NEGATIVE SARS-CoV-2 target nucleic acids are NOT DETECTED. The SARS-CoV-2 RNA is generally detectable in upper and lower  respiratory specimens during the acute  phase of infection. The lowest  concentration of SARS-CoV-2 viral copies this assay can detect is 250  copies / mL. A negative result does not preclude SARS-CoV-2 infection  and should not be used as the sole basis for treatment or other  patient management decisions.  A negative result may occur with  improper specimen collection / handling, submission of specimen other  than nasopharyngeal swab, presence of viral mutation(s) within the  areas targeted by this assay, and inadequate number of viral copies  (<250 copies / mL). A negative result must be combined with clinical  observations, patient history, and epidemiological information. If result is POSITIVE SARS-CoV-2 target nucleic acids are DETECTED. The SARS-CoV-2 RNA is generally detectable in upper and lower  respiratory specimens dur ing the acute phase of infection.  Positive  results are indicative of active infection with SARS-CoV-2.  Clinical  correlation with patient history  and other diagnostic information is  necessary to determine patient infection status.  Positive results do  not rule out bacterial infection or co-infection with other viruses. If result is PRESUMPTIVE POSTIVE SARS-CoV-2 nucleic acids MAY BE PRESENT.   A presumptive positive result was obtained on the submitted specimen  and confirmed on repeat testing.  While 2019 novel coronavirus  (SARS-CoV-2) nucleic acids may be present in the submitted sample  additional confirmatory testing may be necessary for epidemiological  and / or clinical management purposes  to differentiate between  SARS-CoV-2 and other Sarbecovirus currently known to infect humans.  If clinically indicated additional testing with an alternate test  methodology [phone]) is advised. The SARS-CoV-2 RNA is generally  detectable in upper and lower respiratory sp ecimens during the acute  phase of infection. The expected result is Negative. Fact Sheet for Patients:   StrictlyIdeas.no Fact Sheet for Healthcare Providers: BankingDealers.co.za This test is not yet approved or cleared by the [place] FDA and has been authorized for detection and/or diagnosis of SARS-CoV-2 by FDA under an Emergency Use Authorization (EUA).  This EUA will remain in effect (meaning this test can be used) for the duration of the COVID-19 declaration under Section 564(b)(1) of the Act, 21 U.S.C. section 360bbb-3(b)(1), unless the authorization is terminated or revoked sooner. Performed at [facility], [place] [address], [place], [place] [address]          Radiology Studies: Dg Abdomen 1 View  Result Date: 10/22/2018 CLINICAL DATA:  Nasogastric tube placement EXAM: ABDOMEN - 1 VIEW COMPARISON:  Abdominal CT from earlier today FINDINGS: Nasogastric tube tip is in the proximal stomach. Fluid-filled small bowel that is underestimated relative to prior CT. Full collecting system on both sides in the setting of a full urinary bladder. Cholecystectomy. IMPRESSION: 1. The nasogastric tube reaches the stomach. 2. Full upper tract collecting system likely from the distended urinary bladder. Electronically Signed   By: Monte Fantasia M.D.   On: 10/22/2018 04:46   Ct Abdomen Pelvis W Contrast  Result Date: 10/22/2018 CLINICAL DATA:  63 y/o  F; epigastric pain and emesis. EXAM: CT ABDOMEN AND PELVIS WITH CONTRAST TECHNIQUE: Multidetector CT imaging of the abdomen and pelvis was performed using the standard protocol following bolus administration of intravenous contrast. CONTRAST:  136mL OMNIPAQUE IOHEXOL 300 MG/ML  SOLN COMPARISON:  12/24/2005 CT abdomen and pelvis. FINDINGS: Lower chest: No acute abnormality. Small hiatal hernia. Hepatobiliary: 12 mm cyst in the right lobe of liver. No focal liver abnormality is seen. Status post cholecystectomy. No biliary dilatation. Pancreas: Unremarkable. No pancreatic ductal  dilatation or surrounding inflammatory changes. Spleen: Normal in size without focal abnormality. Adrenals/Urinary Tract: Adrenal glands are unremarkable. Multiple renal cysts measuring up to 49 mm in the lower pole of right kidney. Otherwise kidneys are normal, without renal calculi, focal lesion, or hydronephrosis. Bladder is unremarkable. Stomach/Bowel: Distal small bowel obstruction which appears to transition in the left hemipelvis (series 2, image 55 and series 5 images 60-80). No findings of perforation. Normal appearance of the colon and appendix. Stomach is unremarkable. Postsurgical changes at the gastroesophageal junction from prior Nissen fundoplication. Vascular/Lymphatic: Aortic atherosclerosis. No enlarged abdominal or pelvic lymph nodes. Reproductive: Status post hysterectomy. No adnexal masses. Other: No abdominal wall hernia or abnormality. No abdominopelvic ascites. Musculoskeletal: No fracture is seen. IMPRESSION: 1. Distal small bowel obstruction with transition in the left pelvis, possibly a lesions. No findings of perforation. 2. Small hiatal hernia. 3. Aortic Atherosclerosis (ICD10-I70.0). 4. Small  hiatal hernia. Electronically Signed   By: Kristine Garbe M.D.   On: 10/22/2018 03:33        Scheduled Meds: . levothyroxine  50 mcg Intravenous Daily   Continuous Infusions: . sodium chloride 100 mL/hr at 10/23/18 1040  . cefTRIAXone (ROCEPHIN)  IV    . potassium chloride 10 mEq (10/23/18 1228)     LOS: 1 day    Time spent: 25 mins.More than 50% of that time was spent in counseling and/or coordination of care.      Shelly Coss, MD [facility] Pager [phone]  If 7PM-7AM, please contact night-coverage [email or web address] Password [facility] 10/23/2018, 12:45 PM

## 2018-10-23 NOTE — Progress Notes (Signed)
Pt reports passing flatus multiple times on this shift.

## 2018-10-24 LAB — URINE CULTURE

## 2018-10-24 LAB — BASIC METABOLIC PANEL
Anion gap: 10 (ref 5–15)
BUN: 11 mg/dL (ref 8–23)
CO2: 17 mmol/L — ABNORMAL LOW (ref 22–32)
Calcium: 7.7 mg/dL — ABNORMAL LOW (ref 8.9–10.3)
Chloride: 112 mmol/L — ABNORMAL HIGH (ref 98–111)
Creatinine, Ser: 0.57 mg/dL (ref 0.44–1.00)
GFR calc Af Amer: >60 mL/min (ref >60)
GFR calc non Af Amer: >60 mL/min (ref >60)
Glucose, Bld: 70 mg/dL (ref 70–99)
Potassium: 3.2 mmol/L — ABNORMAL LOW (ref 3.5–5.1)
Sodium: 139 mmol/L (ref 135–145)

## 2018-10-24 MED ORDER — LEVOTHYROXINE SODIUM 100 MCG PO TABS: 100.0000 ug | ORAL_TABLET | ORAL | Status: DC

## 2018-10-24 MED ORDER — LEVOTHYROXINE SODIUM 88 MCG PO TABS: 88.0000 ug | ORAL_TABLET | Freq: Every day | ORAL | Status: DC

## 2018-10-24 MED ORDER — LAMOTRIGINE 25 MG PO TABS
50.0000 mg | ORAL_TABLET | Freq: Every day | ORAL | Status: DC
  Filled 2018-10-24 (×2): qty 2

## 2018-10-24 MED ORDER — GABAPENTIN 100 MG PO CAPS
100.0000 mg | ORAL_CAPSULE | Freq: Every day | ORAL | Status: DC
  Administered 2018-10-24: 100 mg via ORAL
  Filled 2018-10-24: qty 1

## 2018-10-24 MED ORDER — DIAZEPAM 5 MG PO TABS
5.0000 mg | ORAL_TABLET | Freq: Two times a day (BID) | ORAL | Status: DC | PRN
  Administered 2018-10-24: 5 mg via ORAL
  Filled 2018-10-24: qty 1

## 2018-10-24 MED ORDER — OXCARBAZEPINE 150 MG PO TABS
150.0000 mg | ORAL_TABLET | Freq: Every day | ORAL | Status: DC
  Administered 2018-10-24: 150 mg via ORAL
  Filled 2018-10-24: qty 1

## 2018-10-24 MED ORDER — LEVOTHYROXINE SODIUM 88 MCG PO TABS
88.0000 ug | ORAL_TABLET | ORAL | Status: DC
  Administered 2018-10-25: 88 ug via ORAL
  Filled 2018-10-24: qty 1

## 2018-10-24 MED ORDER — POTASSIUM CHLORIDE 10 MEQ/100ML IV SOLN
10.0000 meq | INTRAVENOUS | Status: AC
  Administered 2018-10-24 (×5): 10 meq via INTRAVENOUS
  Filled 2018-10-24 (×5): qty 100

## 2018-10-24 MED ORDER — LEVOTHYROXINE SODIUM 100 MCG PO TABS: 100.0000 ug | ORAL_TABLET | Freq: Every day | ORAL | Status: DC

## 2018-10-24 NOTE — Progress Notes (Signed)
PROGRESS NOTE    Heidi Evans  QIH:[id number] DOB: 30-Jul-1955 DOA: 10/21/2018 PCP: Sherald Hess., MD   Brief Narrative: Patient is a 63 year old female with history of Nissen fundoplication, GERD, abdominal hysterectomy, hypothyroidism, depression, migraines who presented to the emergency department with complaints of abdominal pain, nausea and vomiting. CT abdomen/pelvis showed distal small bowel obstruction with transition in the left pelvis.  No perforation.  Started on conservative management.  Assessment & Plan:   Principal Problem:   SBO (small bowel obstruction) (HCC) Active Problems:   Hypothyroidism   Acute lower UTI   Depression   SBO: Has been passing flatus.  Had contrast coming out from the rectum.  Tolerated clear liquid diet today.  Will advance as tolerated.  NG tube removed.  Abdomen pain has improved.  Continue gentle IV fluids.  Hopefully will be discharged home tomorrow.  Suspected urinary tract infection: No history of dysuria.  Patient is afebrile.  White cell count stable.  There are few white cells in urine.  Will discontinue antibiotics.  Urine culture grew multiple species.  Hypothyroidism: resume home synthyroid  Anxiety/depression: Continue home meds  History of migraines, headache: On migraine medications at home.    Hypokalemia: Being supplemented with potassium.         DVT prophylaxis:ScD Code Status: Full Family Communication: None present at the bedside Disposition Plan: Likely home tomorrow   Consultants: General surgery  Procedures: None  Antimicrobials:  Anti-infectives (From admission, onward)   Start     Dose/Rate Route Frequency Ordered Stop   10/23/18 2200  cefTRIAXone (ROCEPHIN) 1 g in sodium chloride 0.9 % 100 mL IVPB  Status:  Discontinued     1 g 200 mL/hr over 30 Minutes Intravenous Every 24 hours 10/22/18 0436 10/23/18 1249   10/22/18 0315  cefTRIAXone (ROCEPHIN) 1 g in sodium chloride 0.9 % 100 mL IVPB      1 g 200 mL/hr over 30 Minutes Intravenous  Once 10/22/18 0306 10/22/18 0407      Subjective: Patient seen and examined the bedside this morning.  NG tube removed.  He appears very comfortable.  Has been passing flatus.  No real bowel movement yet but she passed contrast through the rectum.  Abdominal pain resolved, currently on clear liquid diet.  Objective: Vitals:   10/23/18 0630 10/23/18 1319 10/23/18 2156 10/24/18 0447  BP: 122/72 (!) 141/82 136/83 (!) 146/68  Pulse: (!) 58 67 (!) 56 (!) 58  Resp: 14 17 20 20   Temp: 98.6 F (37 C) 98.6 F (37 C) 98.4 F (36.9 C) 98.1 F (36.7 C)  TempSrc: Oral Oral Oral Oral  SpO2: 99% 99% 97% 99%  Weight:    63.8 kg  Height:        Intake/Output Summary (Last 24 hours) at 10/24/2018 1119 Last data filed at 10/24/2018 0600 Gross per 24 hour  Intake 2054.78 ml  Output 200 ml  Net 1854.78 ml   Filed Weights   10/21/18 2338 10/22/18 0640 10/24/18 0447  Weight: 56.7 kg 58.6 kg 63.8 kg    Examination:  General exam: Appears calm and comfortable ,Not in distress,average built HEENT:PERRL,Oral mucosa moist, Ear/Nose normal on gross exam Respiratory system: Bilateral equal air entry, normal vesicular breath sounds, no wheezes or crackles  Cardiovascular system: S1 & S2 heard, RRR. No JVD, murmurs, rubs, gallops or clicks. Gastrointestinal system: Abdomen is nondistended, soft and nontender. No organomegaly or masses felt. Normal bowel sounds heard. Central nervous system: Alert and oriented. No  focal neurological deficits. Extremities: No edema, no clubbing ,no cyanosis, distal peripheral pulses palpable. Skin: No rashes, lesions or ulcers,no icterus ,no pallor MSK: Normal muscle bulk,tone ,power Psychiatry: Judgement and insight appear normal. Mood & affect appropriate.        Data Reviewed: I have personally reviewed following labs and imaging studies  CBC: Recent Labs  Lab 10/22/18 0020 10/23/18 0449  WBC 10.3 8.5  NEUTROABS  8.3*  --   HGB 13.5 11.9*  HCT 40.5 37.1  MCV 92.0 96.4  PLT 301 161   Basic Metabolic Panel: Recent Labs  Lab 10/22/18 0020 10/23/18 0449 10/23/18 0929 10/24/18 0333  NA 136 140  --  139  K 4.9 3.1*  --  3.2*  CL 100 112*  --  112*  CO2 23 22  --  17*  GLUCOSE 135* 91  --  70  BUN 9 8  --  11  CREATININE 0.92 0.60  --  0.57  CALCIUM 9.5 7.8*  --  7.7*  MG  --   --  2.4  --    GFR: Estimated Creatinine Clearance: 63.2 mL/min (by C-G formula based on SCr of 0.57 mg/dL). Liver Function Tests: Recent Labs  Lab 10/22/18 0020 10/23/18 0449  AST 32 16  ALT 16 12  ALKPHOS 49 32*  BILITOT 1.3* 0.8  PROT 7.1 5.0*  ALBUMIN 4.6 3.2*   Recent Labs  Lab 10/22/18 0020  LIPASE 36   No results for input(s): AMMONIA in the last 168 hours. Coagulation Profile: Recent Labs  Lab 10/22/18 0429  INR 1.1   Cardiac Enzymes: Recent Labs  Lab 10/22/18 0020  TROPONINI <0.03   BNP (last 3 results) No results for input(s): PROBNP in the last 8760 hours. HbA1C: No results for input(s): HGBA1C in the last 72 hours. CBG: No results for input(s): GLUCAP in the last 168 hours. Lipid Profile: No results for input(s): CHOL, HDL, LDLCALC, TRIG, CHOLHDL, LDLDIRECT in the last 72 hours. Thyroid Function Tests: No results for input(s): TSH, T4TOTAL, FREET4, T3FREE, THYROIDAB in the last 72 hours. Anemia Panel: No results for input(s): VITAMINB12, FOLATE, FERRITIN, TIBC, IRON, RETICCTPCT in the last 72 hours. Sepsis Labs: No results for input(s): PROCALCITON, LATICACIDVEN in the last 168 hours.  Recent Results (from the past 240 hour(s))  Culture, Urine     Status: Abnormal   Collection Time: 10/22/18 12:34 AM  Result Value Ref Range Status   Specimen Description   Final    URINE, RANDOM Performed at [facility] [address] [place], [place] [address]    Special Requests   Final    NONE Performed at [facility], [place] [address]  [place], [place] [address]    Culture MULTIPLE SPECIES PRESENT, SUGGEST RECOLLECTION (A)  Final   Report Status 10/24/2018 FINAL  Final  SARS Coronavirus 2 (CEPHEID - Performed in [place] [facility] lab), Hosp Order     Status: None   Collection Time: 10/22/18  4:50 AM  Result Value Ref Range Status   SARS Coronavirus 2 NEGATIVE NEGATIVE Final    Comment: (NOTE) If result is NEGATIVE SARS-CoV-2 target nucleic acids are NOT DETECTED. The SARS-CoV-2 RNA is generally detectable in upper and lower  respiratory specimens during the acute phase of infection. The lowest  concentration of SARS-CoV-2 viral copies this assay can detect is 250  copies / mL. A negative result does not preclude SARS-CoV-2 infection  and should not be used as the sole  basis for treatment or other  patient management decisions.  A negative result may occur with  improper specimen collection / handling, submission of specimen other  than nasopharyngeal swab, presence of viral mutation(s) within the  areas targeted by this assay, and inadequate number of viral copies  (<250 copies / mL). A negative result must be combined with clinical  observations, patient history, and epidemiological information. If result is POSITIVE SARS-CoV-2 target nucleic acids are DETECTED. The SARS-CoV-2 RNA is generally detectable in upper and lower  respiratory specimens dur ing the acute phase of infection.  Positive  results are indicative of active infection with SARS-CoV-2.  Clinical  correlation with patient history and other diagnostic information is  necessary to determine patient infection status.  Positive results do  not rule out bacterial infection or co-infection with other viruses. If result is PRESUMPTIVE POSTIVE SARS-CoV-2 nucleic acids MAY BE PRESENT.   A presumptive positive result was obtained on the submitted specimen  and confirmed on repeat testing.  While 2019 novel coronavirus  (SARS-CoV-2) nucleic acids may be  present in the submitted sample  additional confirmatory testing may be necessary for epidemiological  and / or clinical management purposes  to differentiate between  SARS-CoV-2 and other Sarbecovirus currently known to infect humans.  If clinically indicated additional testing with an alternate test  methodology [phone]) is advised. The SARS-CoV-2 RNA is generally  detectable in upper and lower respiratory sp ecimens during the acute  phase of infection. The expected result is Negative. Fact Sheet for Patients:  StrictlyIdeas.no Fact Sheet for Healthcare Providers: BankingDealers.co.za This test is not yet approved or cleared by the [place] FDA and has been authorized for detection and/or diagnosis of SARS-CoV-2 by FDA under an Emergency Use Authorization (EUA).  This EUA will remain in effect (meaning this test can be used) for the duration of the COVID-19 declaration under Section 564(b)(1) of the Act, 21 U.S.C. section 360bbb-3(b)(1), unless the authorization is terminated or revoked sooner. Performed at [facility], [place] [address], [place], [place] [address]          Radiology Studies: Dg Abd Portable 1v-small Bowel Obstruction Protocol-initial, 8 Hr Delay  Result Date: 10/23/2018 CLINICAL DATA:  Small-bowel obstruction EXAM: PORTABLE ABDOMEN - 1 VIEW COMPARISON:  10/23/2018 FINDINGS: Oral contrast is seen to the level of the rectum. There are persistent dilated small bowel loops measuring up to approximately 4 cm. There is no pneumatosis or free air. The nasogastric tube appears coiled, the tip is not visualized. Phleboliths are noted in the patient's pelvis. IMPRESSION: 1. Oral contrast seen to the level of the rectum. 2. Persistent dilated loops of small bowel measuring up to approximately 4 cm. Electronically Signed   By: Constance Holster M.D.   On: 10/23/2018 23:35   Dg Abd Portable 1v-small Bowel  Protocol-position Verification  Result Date: 10/23/2018 CLINICAL DATA:  NG tube placement. EXAM: PORTABLE ABDOMEN - 1 VIEW COMPARISON:  Plain film of the abdomen dated 10/22/2018. CT abdomen dated 10/22/2018. FINDINGS: Nasogastric tube is coiled within the stomach with tip at the stomach fundus. Mildly distended small bowel loops in the LEFT upper quadrant. Remainder of the bowel gas pattern appears nonobstructive. No evidence of soft tissue mass or abnormal fluid collection. No evidence of free intraperitoneal air. IMPRESSION: 1. Nasogastric tube coiled within the stomach with tip at the stomach fundus. 2. Persistent mildly distended gas-filled loops of small bowel in the LEFT upper quadrant, compatible with the small-bowel obstruction demonstrated  on recent CT. Electronically Signed   By: Franki Cabot M.D.   On: 10/23/2018 13:52        Scheduled Meds: . levothyroxine  50 mcg Intravenous Daily   Continuous Infusions: . sodium chloride 100 mL/hr at 10/24/18 1024  . potassium chloride 10 mEq (10/24/18 1026)     LOS: 2 days    Time spent: 25 mins.More than 50% of that time was spent in counseling and/or coordination of care.      Shelly Coss, MD [facility] Pager [phone]  If 7PM-7AM, please contact night-coverage [email or web address] Password [email or web address] 10/24/2018, 11:19 AM

## 2018-10-24 NOTE — Progress Notes (Signed)
SBO (small bowel obstruction) (HCC)  Subjective: Had a BM, passing flatus.  Contrast in rectum on AXR  Objective: Vital signs in last 24 hours: Temp:  [98.1 F (36.7 C)-98.6 F (37 C)] 98.1 F (36.7 C) (05/24 0447) Pulse Rate:  [56-67] 58 (05/24 0447) Resp:  [17-20] 20 (05/24 0447) BP: (136-146)/(68-83) 146/68 (05/24 0447) SpO2:  [97 %-99 %] 99 % (05/24 0447) Weight:  [63.8 kg] 63.8 kg (05/24 0447) Last BM Date: 10/23/18  Intake/Output from previous day: 05/23 0701 - 05/24 0700 In: 2054.8 [I.V.:1519.2; NG/GT:90; IV Piggyback:445.6] Out: 200 [Emesis/NG output:200] Intake/Output this shift: No intake/output data recorded.  General appearance: alert and cooperative GI: soft, non-distended  Lab Results:  Results for orders placed or performed during the hospital encounter of 10/21/18 (from the past 24 hour(s))  Magnesium     Status: None   Collection Time: 10/23/18  9:29 AM  Result Value Ref Range   Magnesium 2.4 1.7 - 2.4 mg/dL  Basic metabolic panel     Status: Abnormal   Collection Time: 10/24/18  3:33 AM  Result Value Ref Range   Sodium 139 135 - 145 mmol/L   Potassium 3.2 (L) 3.5 - 5.1 mmol/L   Chloride 112 (H) 98 - 111 mmol/L   CO2 17 (L) 22 - 32 mmol/L   Glucose, Bld 70 70 - 99 mg/dL   BUN 11 8 - 23 mg/dL   Creatinine, Ser 0.57 0.44 - 1.00 mg/dL   Calcium 7.7 (L) 8.9 - 10.3 mg/dL   GFR calc non Af Amer >60 >60 mL/min   GFR calc Af Amer >60 >60 mL/min   Anion gap 10 5 - 15     Studies/Results Radiology     MEDS, Scheduled . levothyroxine  50 mcg Intravenous Daily     Assessment: SBO (small bowel obstruction) ([place]) resolving  Plan: D/c NG Clears, ADAT   LOS: 2 days    Rosario Adie, MD [facility], [place] [phone]   10/24/2018 8:55 AM

## 2018-10-25 LAB — BASIC METABOLIC PANEL
Anion gap: 6 (ref 5–15)
BUN: 6 mg/dL — ABNORMAL LOW (ref 8–23)
CO2: 26 mmol/L (ref 22–32)
Calcium: 8.6 mg/dL — ABNORMAL LOW (ref 8.9–10.3)
Chloride: 109 mmol/L (ref 98–111)
Creatinine, Ser: 0.66 mg/dL (ref 0.44–1.00)
GFR calc Af Amer: >60 mL/min (ref >60)
GFR calc non Af Amer: >60 mL/min (ref >60)
Glucose, Bld: 98 mg/dL (ref 70–99)
Potassium: 3.9 mmol/L (ref 3.5–5.1)
Sodium: 141 mmol/L (ref 135–145)

## 2018-10-25 NOTE — Progress Notes (Signed)
SBO (small bowel obstruction) (HCC)  Subjective: Had a BM, passing flatus.  Contrast in rectum on AXR.  Has done well with diet past 24 h  Objective: Vital signs in last 24 hours: Temp:  [98.2 F (36.8 C)-98.6 F (37 C)] 98.2 F (36.8 C) (05/25 0533) Pulse Rate:  [61-63] 61 (05/25 0533) Resp:  [14-16] 14 (05/25 0533) BP: (141-153)/(71-76) 153/71 (05/25 0533) SpO2:  [96 %-99 %] 99 % (05/25 0533) Weight:  [57.4 kg] 57.4 kg (05/25 0533) Last BM Date: 10/24/18  Intake/Output from previous day: 05/24 0701 - 05/25 0700 In: 609.3 [P.O.:120; IV Piggyback:489.3] Out: 120 [Urine:120] Intake/Output this shift: No intake/output data recorded.  General appearance: alert and cooperative GI: soft, non-distended  Lab Results:  Results for orders placed or performed during the hospital encounter of 10/21/18 (from the past 24 hour(s))  Basic metabolic panel     Status: Abnormal   Collection Time: 10/25/18  4:29 AM  Result Value Ref Range   Sodium 141 135 - 145 mmol/L   Potassium 3.9 3.5 - 5.1 mmol/L   Chloride 109 98 - 111 mmol/L   CO2 26 22 - 32 mmol/L   Glucose, Bld 98 70 - 99 mg/dL   BUN 6 (L) 8 - 23 mg/dL   Creatinine, Ser 0.66 0.44 - 1.00 mg/dL   Calcium 8.6 (L) 8.9 - 10.3 mg/dL   GFR calc non Af Amer >60 >60 mL/min   GFR calc Af Amer >60 >60 mL/min   Anion gap 6 5 - 15     Studies/Results Radiology     MEDS, Scheduled . gabapentin  100 mg Oral QHS  . lamoTRIgine  50 mg Oral Daily  . [START ON 10/26/2018] levothyroxine  100 mcg Oral QODAY  . levothyroxine  88 mcg Oral QODAY  . Oxcarbazepine  150 mg Oral QHS     Assessment: SBO (small bowel obstruction) ([place]) resolving  Plan: [place] for d/c  F/u with surgery prn   LOS: 3 days    Rosario Adie, MD [facility], [place]   10/25/2018 8:18 AM

## 2018-10-25 NOTE — Discharge Summary (Signed)
Physician Discharge Summary  Heidi Evans VQM:[id number] DOB: Jan 29, 1956 DOA: 10/21/2018  PCP: Sherald Hess., MD  Admit date: 10/21/2018 Discharge date: 10/25/2018  Admitted From: Home Disposition:  Home  Discharge Condition:Stable CODE STATUS:FULL Diet recommendation:Soft diet for next 2-3 days  Brief/Interim Summary:  Patient is a 63 year old female with history of Nissen fundoplication, GERD, abdominal hysterectomy, hypothyroidism, depression, migraines who presented to the emergency department with complaints of abdominal pain, nausea and vomiting. CT abdomen/pelvis showed distal small bowel obstruction with transition in the left pelvis. No perforation. Started on conservative management. NG tube placed.  General surgery consulted.  Small bowel obstruction finally resolved.  Currently she is having bowel movements.  Tolerating soft diet.  She is stable for discharge to home today.  Following problems were addressed during her hospitalization:  SBO: Resolved.Having bowel movements now.  Tolerated soft diet today.  NG tube removed.  Abdomen pain resolved.   Suspected urinary tract infection: No history of dysuria.  Patient is afebrile.  White cell count stable.  There are few white cells in urine.  Will discontinue antibiotics.  Urine culture grew multiple species.  Hypothyroidism: resume home synthyroid  Anxiety/depression: Continue home meds  History of migraines, headache: On migraine medications at home.    Hypokalemia: Supplemented with potassium.   Discharge Diagnoses:  Principal Problem:   SBO (small bowel obstruction) (HCC) Active Problems:   Hypothyroidism   Acute lower UTI   Depression    Discharge Instructions  Discharge Instructions    Diet - low sodium heart healthy   Complete by:  As directed    Discharge instructions   Complete by:  As directed    1)Follow up with your PCP in 1-2 weeks. 2)Take soft diet for next 2-3 days.   Increase  activity slowly   Complete by:  As directed      Allergies as of 10/25/2018      Reactions   Codeine Itching   Other Other (See Comments)   Some anesthesia medications cause her nausea (unknown which)      Medication List    TAKE these medications   diazepam 5 MG tablet Commonly known as:  VALIUM Take 5 mg by mouth 2 (two) times daily as needed for anxiety.   Erenumab-aooe 70 MG/ML Soaj Commonly known as:  Aimovig Inject 70 mg into the skin every 30 (thirty) days.   gabapentin 100 MG capsule Commonly known as:  NEURONTIN Take 100 mg by mouth at bedtime.   lamoTRIgine 200 MG tablet Commonly known as:  LAMICTAL Take 50 mg by mouth daily.   levothyroxine 88 MCG tablet Commonly known as:  SYNTHROID Take 88 mcg by mouth daily before breakfast. Alternate with levothyroxine 100 mcg   levothyroxine 100 MCG tablet Commonly known as:  SYNTHROID Take 100 mcg by mouth daily. Alternate with levothyroxine 88 mcg   Oxcarbazepine 300 MG tablet Commonly known as:  TRILEPTAL Take 150 mg by mouth at bedtime.   rizatriptan 5 MG disintegrating tablet Commonly known as:  Maxalt-MLT Take 1 tablet (5 mg total) by mouth as needed. May repeat in 2 hours if needed, no refill in less than 30 days      Follow-up Information    Sherald Hess., MD. Schedule an appointment as soon as possible for a visit in 1 week(s).   Specialty:  Family Medicine Contact information: [place] [address] [phone]          Allergies  Allergen  Reactions  . Codeine Itching  . Other Other (See Comments)    Some anesthesia medications cause her nausea (unknown which)    Consultations:  General Surgery   Procedures/Studies: Dg Abdomen 1 View  Result Date: 10/22/2018 CLINICAL DATA:  Nasogastric tube placement EXAM: ABDOMEN - 1 VIEW COMPARISON:  Abdominal CT from earlier today FINDINGS: Nasogastric tube tip is in the proximal stomach. Fluid-filled small bowel that is  underestimated relative to prior CT. Full collecting system on both sides in the setting of a full urinary bladder. Cholecystectomy. IMPRESSION: 1. The nasogastric tube reaches the stomach. 2. Full upper tract collecting system likely from the distended urinary bladder. Electronically Signed   By: Monte Fantasia M.D.   On: 10/22/2018 04:46   Ct Abdomen Pelvis W Contrast  Result Date: 10/22/2018 CLINICAL DATA:  63 y/o  F; epigastric pain and emesis. EXAM: CT ABDOMEN AND PELVIS WITH CONTRAST TECHNIQUE: Multidetector CT imaging of the abdomen and pelvis was performed using the standard protocol following bolus administration of intravenous contrast. CONTRAST:  141mL OMNIPAQUE IOHEXOL 300 MG/ML  SOLN COMPARISON:  12/24/2005 CT abdomen and pelvis. FINDINGS: Lower chest: No acute abnormality. Small hiatal hernia. Hepatobiliary: 12 mm cyst in the right lobe of liver. No focal liver abnormality is seen. Status post cholecystectomy. No biliary dilatation. Pancreas: Unremarkable. No pancreatic ductal dilatation or surrounding inflammatory changes. Spleen: Normal in size without focal abnormality. Adrenals/Urinary Tract: Adrenal glands are unremarkable. Multiple renal cysts measuring up to 49 mm in the lower pole of right kidney. Otherwise kidneys are normal, without renal calculi, focal lesion, or hydronephrosis. Bladder is unremarkable. Stomach/Bowel: Distal small bowel obstruction which appears to transition in the left hemipelvis (series 2, image 55 and series 5 images 60-80). No findings of perforation. Normal appearance of the colon and appendix. Stomach is unremarkable. Postsurgical changes at the gastroesophageal junction from prior Nissen fundoplication. Vascular/Lymphatic: Aortic atherosclerosis. No enlarged abdominal or pelvic lymph nodes. Reproductive: Status post hysterectomy. No adnexal masses. Other: No abdominal wall hernia or abnormality. No abdominopelvic ascites. Musculoskeletal: No fracture is seen.  IMPRESSION: 1. Distal small bowel obstruction with transition in the left pelvis, possibly a lesions. No findings of perforation. 2. Small hiatal hernia. 3. Aortic Atherosclerosis (ICD10-I70.0). 4. Small hiatal hernia. Electronically Signed   By: Kristine Garbe M.D.   On: 10/22/2018 03:33   Dg Abd Portable 1v-small Bowel Obstruction Protocol-initial, 8 Hr Delay  Result Date: 10/23/2018 CLINICAL DATA:  Small-bowel obstruction EXAM: PORTABLE ABDOMEN - 1 VIEW COMPARISON:  10/23/2018 FINDINGS: Oral contrast is seen to the level of the rectum. There are persistent dilated small bowel loops measuring up to approximately 4 cm. There is no pneumatosis or free air. The nasogastric tube appears coiled, the tip is not visualized. Phleboliths are noted in the patient's pelvis. IMPRESSION: 1. Oral contrast seen to the level of the rectum. 2. Persistent dilated loops of small bowel measuring up to approximately 4 cm. Electronically Signed   By: Constance Holster M.D.   On: 10/23/2018 23:35   Dg Abd Portable 1v-small Bowel Protocol-position Verification  Result Date: 10/23/2018 CLINICAL DATA:  NG tube placement. EXAM: PORTABLE ABDOMEN - 1 VIEW COMPARISON:  Plain film of the abdomen dated 10/22/2018. CT abdomen dated 10/22/2018. FINDINGS: Nasogastric tube is coiled within the stomach with tip at the stomach fundus. Mildly distended small bowel loops in the LEFT upper quadrant. Remainder of the bowel gas pattern appears nonobstructive. No evidence of soft tissue mass or abnormal fluid collection. No evidence of free  intraperitoneal air. IMPRESSION: 1. Nasogastric tube coiled within the stomach with tip at the stomach fundus. 2. Persistent mildly distended gas-filled loops of small bowel in the LEFT upper quadrant, compatible with the small-bowel obstruction demonstrated on recent CT. Electronically Signed   By: Franki Cabot M.D.   On: 10/23/2018 13:52       Subjective: Patient seen and examined the bedside  this morning.  Remains comfortable.  Having bowel movements.  Ambulating without problem.  Stable for discharge  Discharge Exam: Vitals:   10/24/18 2118 10/25/18 0533  BP: (!) 141/76 (!) 153/71  Pulse: 63 61  Resp: 16 14  Temp: 98.6 F (37 C) 98.2 F (36.8 C)  SpO2: 96% 99%   Vitals:   10/23/18 2156 10/24/18 0447 10/24/18 2118 10/25/18 0533  BP: 136/83 (!) 146/68 (!) 141/76 (!) 153/71  Pulse: (!) 56 (!) 58 63 61  Resp: 20 20 16 14   Temp: 98.4 F (36.9 C) 98.1 F (36.7 C) 98.6 F (37 C) 98.2 F (36.8 C)  TempSrc: Oral Oral Oral Oral  SpO2: 97% 99% 96% 99%  Weight:  63.8 kg  57.4 kg  Height:        General: Pt is alert, awake, not in acute distress Cardiovascular: RRR, S1/S2 +, no rubs, no gallops Respiratory: CTA bilaterally, no wheezing, no rhonchi Abdominal: Soft, NT, ND, bowel sounds + Extremities: no edema, no cyanosis    The results of significant diagnostics from this hospitalization (including imaging, microbiology, ancillary and laboratory) are listed below for reference.     Microbiology: Recent Results (from the past 240 hour(s))  Culture, Urine     Status: Abnormal   Collection Time: 10/22/18 12:34 AM  Result Value Ref Range Status   Specimen Description   Final    URINE, RANDOM Performed at [place] [address] [place], [place] [address]    Special Requests   Final    NONE Performed at [facility], [place] [address] [place],  [address]    Culture MULTIPLE SPECIES PRESENT, SUGGEST RECOLLECTION (A)  Final   Report Status 10/24/2018 FINAL  Final  SARS Coronavirus 2 (CEPHEID - Performed in [place] hospital lab), Hosp Order     Status: None   Collection Time: 10/22/18  4:50 AM  Result Value Ref Range Status   SARS Coronavirus 2 NEGATIVE NEGATIVE Final    Comment: (NOTE) If result is NEGATIVE SARS-CoV-2 target nucleic acids are NOT DETECTED. The SARS-CoV-2 RNA is generally detectable in upper  and lower  respiratory specimens during the acute phase of infection. The lowest  concentration of SARS-CoV-2 viral copies this assay can detect is 250  copies / mL. A negative result does not preclude SARS-CoV-2 infection  and should not be used as the sole basis for treatment or other  patient management decisions.  A negative result may occur with  improper specimen collection / handling, submission of specimen other  than nasopharyngeal swab, presence of viral mutation(s) within the  areas targeted by this assay, and inadequate number of viral copies  (<250 copies / mL). A negative result must be combined with clinical  observations, patient history, and epidemiological information. If result is POSITIVE SARS-CoV-2 target nucleic acids are DETECTED. The SARS-CoV-2 RNA is generally detectable in upper and lower  respiratory specimens dur ing the acute phase of infection.  Positive  results are indicative of active infection with SARS-CoV-2.  Clinical  correlation with patient history and other diagnostic information  is  necessary to determine patient infection status.  Positive results do  not rule out bacterial infection or co-infection with other viruses. If result is PRESUMPTIVE POSTIVE SARS-CoV-2 nucleic acids MAY BE PRESENT.   A presumptive positive result was obtained on the submitted specimen  and confirmed on repeat testing.  While 2019 novel coronavirus  (SARS-CoV-2) nucleic acids may be present in the submitted sample  additional confirmatory testing may be necessary for epidemiological  and / or clinical management purposes  to differentiate between  SARS-CoV-2 and other Sarbecovirus currently known to infect humans.  If clinically indicated additional testing with an alternate test  methodology [phone]) is advised. The SARS-CoV-2 RNA is generally  detectable in upper and lower respiratory sp ecimens during the acute  phase of infection. The expected result is  Negative. Fact Sheet for Patients:  StrictlyIdeas.no Fact Sheet for Healthcare Providers: BankingDealers.co.za This test is not yet approved or cleared by the [place] FDA and has been authorized for detection and/or diagnosis of SARS-CoV-2 by FDA under an Emergency Use Authorization (EUA).  This EUA will remain in effect (meaning this test can be used) for the duration of the COVID-19 declaration under Section 564(b)(1) of the Act, 21 U.S.C. section 360bbb-3(b)(1), unless the authorization is terminated or revoked sooner. Performed at [facility], [place] [address] [place], [place] [address]      Labs: BNP (last 3 results) No results for input(s): BNP in the last 8760 hours. Basic Metabolic Panel: Recent Labs  Lab 10/22/18 0020 10/23/18 0449 10/23/18 0929 10/24/18 0333 10/25/18 0429  NA 136 140  --  139 141  K 4.9 3.1*  --  3.2* 3.9  CL 100 112*  --  112* 109  CO2 23 22  --  17* 26  GLUCOSE 135* 91  --  70 98  BUN 9 8  --  11 6*  CREATININE 0.92 0.60  --  0.57 0.66  CALCIUM 9.5 7.8*  --  7.7* 8.6*  MG  --   --  2.4  --   --    Liver Function Tests: Recent Labs  Lab 10/22/18 0020 10/23/18 0449  AST 32 16  ALT 16 12  ALKPHOS 49 32*  BILITOT 1.3* 0.8  PROT 7.1 5.0*  ALBUMIN 4.6 3.2*   Recent Labs  Lab 10/22/18 0020  LIPASE 36   No results for input(s): AMMONIA in the last 168 hours. CBC: Recent Labs  Lab 10/22/18 0020 10/23/18 0449  WBC 10.3 8.5  NEUTROABS 8.3*  --   HGB 13.5 11.9*  HCT 40.5 37.1  MCV 92.0 96.4  PLT 301 278   Cardiac Enzymes: Recent Labs  Lab 10/22/18 0020  TROPONINI <0.03   BNP: Invalid input(s): POCBNP CBG: No results for input(s): GLUCAP in the last 168 hours. D-Dimer No results for input(s): DDIMER in the last 72 hours. Hgb A1c No results for input(s): HGBA1C in the last 72 hours. Lipid Profile No results for input(s): CHOL, HDL, LDLCALC, TRIG,  CHOLHDL, LDLDIRECT in the last 72 hours. Thyroid function studies No results for input(s): TSH, T4TOTAL, T3FREE, THYROIDAB in the last 72 hours.  Invalid input(s): FREET3 Anemia work up No results for input(s): VITAMINB12, FOLATE, FERRITIN, TIBC, IRON, RETICCTPCT in the last 72 hours. Urinalysis    Component Value Date/Time   COLORURINE YELLOW 10/22/2018 0034   APPEARANCEUR CLEAR 10/22/2018 0034   LABSPEC 1.013 10/22/2018 0034   PHURINE 9.0 (H) 10/22/2018 0034   GLUCOSEU NEGATIVE 10/22/2018 0034  HGBUR NEGATIVE 10/22/2018 0034   BILIRUBINUR NEGATIVE 10/22/2018 0034   KETONESUR 5 (A) 10/22/2018 0034   PROTEINUR 30 (A) 10/22/2018 0034   UROBILINOGEN 0.2 04/13/2007 0825   NITRITE NEGATIVE 10/22/2018 0034   LEUKOCYTESUR MODERATE (A) 10/22/2018 0034   Sepsis Labs Invalid input(s): PROCALCITONIN,  WBC,  LACTICIDVEN Microbiology Recent Results (from the past 240 hour(s))  Culture, Urine     Status: Abnormal   Collection Time: 10/22/18 12:34 AM  Result Value Ref Range Status   Specimen Description   Final    URINE, RANDOM Performed at [facility], [place] [address], [place], [place] [address]    Special Requests   Final    NONE Performed at [facility], [place] [address] [place], [place] [address]    Culture MULTIPLE SPECIES PRESENT, SUGGEST RECOLLECTION (A)  Final   Report Status 10/24/2018 FINAL  Final  SARS Coronavirus 2 (CEPHEID - Performed in [place] hospital lab), Hosp Order     Status: None   Collection Time: 10/22/18  4:50 AM  Result Value Ref Range Status   SARS Coronavirus 2 NEGATIVE NEGATIVE Final    Comment: (NOTE) If result is NEGATIVE SARS-CoV-2 target nucleic acids are NOT DETECTED. The SARS-CoV-2 RNA is generally detectable in upper and lower  respiratory specimens during the acute phase of infection. The lowest  concentration of SARS-CoV-2 viral copies this assay can detect is 250  copies / mL. A negative result does  not preclude SARS-CoV-2 infection  and should not be used as the sole basis for treatment or other  patient management decisions.  A negative result may occur with  improper specimen collection / handling, submission of specimen other  than nasopharyngeal swab, presence of viral mutation(s) within the  areas targeted by this assay, and inadequate number of viral copies  (<250 copies / mL). A negative result must be combined with clinical  observations, patient history, and epidemiological information. If result is POSITIVE SARS-CoV-2 target nucleic acids are DETECTED. The SARS-CoV-2 RNA is generally detectable in upper and lower  respiratory specimens dur ing the acute phase of infection.  Positive  results are indicative of active infection with SARS-CoV-2.  Clinical  correlation with patient history and other diagnostic information is  necessary to determine patient infection status.  Positive results do  not rule out bacterial infection or co-infection with other viruses. If result is PRESUMPTIVE POSTIVE SARS-CoV-2 nucleic acids MAY BE PRESENT.   A presumptive positive result was obtained on the submitted specimen  and confirmed on repeat testing.  While 2019 novel coronavirus  (SARS-CoV-2) nucleic acids may be present in the submitted sample  additional confirmatory testing may be necessary for epidemiological  and / or clinical management purposes  to differentiate between  SARS-CoV-2 and other Sarbecovirus currently known to infect humans.  If clinically indicated additional testing with an alternate test  methodology [phone]) is advised. The SARS-CoV-2 RNA is generally  detectable in upper and lower respiratory sp ecimens during the acute  phase of infection. The expected result is Negative. Fact Sheet for Patients:  StrictlyIdeas.no Fact Sheet for Healthcare Providers: BankingDealers.co.za This test is not yet approved or  cleared by the [place] FDA and has been authorized for detection and/or diagnosis of SARS-CoV-2 by FDA under an Emergency Use Authorization (EUA).  This EUA will remain in effect (meaning this test can be used) for the duration of the COVID-19 declaration under Section 564(b)(1) of the Act, 21 U.S.C. section 360bbb-3(b)(1),  unless the authorization is terminated or revoked sooner. Performed at [facility], [place] [address] [place], [place] [address]     Please note: You were cared for by a hospitalist during your hospital stay. Once you are discharged, your primary care physician will handle any further medical issues. Please note that NO REFILLS for any discharge medications will be authorized once you are discharged, as it is imperative that you return to your primary care physician (or establish a relationship with a primary care physician if you do not have one) for your post hospital discharge needs so that they can reassess your need for medications and monitor your lab values.    Time coordinating discharge: 40 minutes  SIGNED:   Shelly Coss, MD  [facility] 10/25/2018, 9:54 AM Pager [phone]  If 7PM-7AM, please contact night-coverage [email or web address] Password [email or web address]

## 2018-10-25 NOTE — Progress Notes (Signed)
Went over discharge papers with patient.  All questions answered.  VSS.   Medications returned to patient.

## 2019-01-16 NOTE — Progress Notes (Signed)
Virtual Visit via Video Note  I connected with Heidi Evans on 01/16/19 at  2:15 PM EDT by a video enabled telemedicine application and verified that I am speaking with the correct person using two identifiers.  Location: Patient: At her home  Provider: In the office    I discussed the limitations of evaluation and management by telemedicine and the availability of in person appointments. The patient expressed understanding and agreed to proceed.  History of Present Illness: Heidi Debruler Powersis a 63 year old female, seen in request byher primary care physician Dr.Foster, Herbie [place] Mfor evaluation of chronic headache, left neck pain, initial evaluation was on verbal 18 2019, she is accompanied by her husband at today's clinical visit.  I have reviewed and summarized the referring note from the referring physician.She had a past medical history of bipolar disorder, is under the care of psychiatrist Dr. Edwyna Perfect, taking polypharmacy treatment, gabapentin 100 mg at bedtime, Trileptal 300 mg at bedtime, lamotrigine 400 mg a day,  She does have a history of chronic migraine headaches, previous migraine or retro-orbital area severe pounding headache with associated light noise sensitivity, nauseous, since 2015, she began to have daily headaches, woke up with holoacranial pressure headaches, also has light noise sensitivity, flashing light in her peripheral visual field, mild nauseous, lasting all day, she tried Tylenol without helping her symptoms.  In addition, she complains of left-sided neck pain, radiating pain to left shoulder, upper extremity, she denies trouble walking, left upper extremity sensorimotor deficit.  She has trouble sleeping,denies snoring, choking episodes.  UPDATE July 19, 2018 Heidi Evans is a 63 year old female who presents for follow-up today to discuss her MRI results.  She is accompanied by her husband at today's appointment. Initial evaluation was in  November 2019 by Dr. Krista Blue for chronic headaches since 2015, and left neck pain.  She had an abnormal MRI of the brain in December 2019 showing an 8x8x9 left prepontine area calcified mass, likely meningioma.  She then had MRI of the brain with and without contrast in February 2020 showing a left pre-pontine area mass 11 x 8 x 10 mm, likely meningioma.  She had a CT scan of her cervical spine in November 2019 that showed multilevel degenerative changes and moderate foraminal narrowing at C6-C7.    She reports that she continues to have 3 migraines per week.  She has been taking the Maxalt and she reports that it does relieve her headaches. Usually 1 tablet will improve her headaches, occasionally she has had to take 2 tablets.  She is currently taking gabapentin, Trileptal and Lamictal for her headaches in addition to her bipolar disorder.  In the past she is tried Topamax.  She reports when she has the headaches the pain starts to the back of her neck and then spread to her entire head causing light sensitivity and nausea.  She presents today for follow-up.  She is requesting a copy of her MRI reports and a CD disc to share with her primary care provider.  Update January 18, 2019 Heidi: Incidental MRI finding of meningioma, history of migraine headaches, started on Aimovig. She was in the hospital May 2020 for SBO. She is off all her medications except thyroid medication. She is off mood medications. She did not start Aimovig.  Her headaches are much better. She sometimes wakes up with headaches, she may wake up at 2 am, not go back to sleep. 3 migraines a week, this is good for her. She is  off bipolar medications. She has chronic left neck pain, has worsened some since receiving IV potassium in the hospital. She does see psychiatrist. She does have some trouble "losing her thought", and trouble with math. She thinks she may have something with her connective tissue.   Observations/Objective: Is alert and  oriented, speech is clear, somewhat scattered, facial symmetry noted, follows commands  MRI of the brain 07/12/2018 IMPRESSION: This MRI of the brain with and without contrast shows the following: 1.    There is an 11 x 8 x 10 mm enhancing extra-axial mass in the pre-pontine cistern on the left.    It appears to be heavily calcified and is most consistent with a prepontine meningioma.  It is adjacent to the left trigeminal nerve as a nerve enters Meckel's cave without clearly distorting it. 2.    Some scattered T2/flair hyperintense foci in the hemispheres consistent with mild stable chronic microvascular ischemic change. 3.    No acute findings.  Assessment and Plan: 1.  Migraine headache 2.  Meningioma, incidental MRI finding -Her headaches are under much better control, she may have 3 migraines a week, this is better for her -She has stopped all of her medications with the exception of her thyroid medicine, she does not wish to start other medications -She has history of bipolar disorder, is off medications, her thoughts are somewhat scattered today, she does report follow-up with psychiatry -She should continue follow-up with PCP -We will repeat MRI of the brain at next visit to reevaluate meningioma -She will follow-up in 4 months with Dr. Krista Blue or sooner if needed  Follow Up Instructions: 4 months with Dr. Krista Blue 05/18/2019   I discussed the assessment and treatment plan with the patient. The patient was provided an opportunity to ask questions and all were answered. The patient agreed with the plan and demonstrated an understanding of the instructions.   The patient was advised to call back or seek an in-person evaluation if the symptoms worsen or if the condition fails to improve as anticipated.  I provided 15 minutes of non-face-to-face time during this encounter.  Evangeline Dakin, DNP  [facility] [address] [place], [place] [address] [phone]

## 2019-01-17 ENCOUNTER — Other Ambulatory Visit: Payer: Self-pay

## 2019-01-17 ENCOUNTER — Telehealth (INDEPENDENT_AMBULATORY_CARE_PROVIDER_SITE_OTHER): Payer: Medicare Other | Admitting: Neurology

## 2019-01-17 ENCOUNTER — Encounter: Payer: Self-pay | Admitting: Neurology

## 2019-01-17 DIAGNOSIS — R51 Headache: Secondary | ICD-10-CM

## 2019-01-17 DIAGNOSIS — R519 Headache, unspecified: Secondary | ICD-10-CM

## 2019-01-19 NOTE — Progress Notes (Signed)
I have reviewed and agreed above plan. 

## 2019-04-25 ENCOUNTER — Telehealth: Payer: Self-pay | Admitting: Neurology

## 2019-04-26 NOTE — Telephone Encounter (Signed)
error 

## 2019-05-18 ENCOUNTER — Encounter: Payer: Self-pay | Admitting: Neurology

## 2019-05-18 ENCOUNTER — Telehealth (INDEPENDENT_AMBULATORY_CARE_PROVIDER_SITE_OTHER): Payer: Medicare Other | Admitting: Neurology

## 2019-05-18 ENCOUNTER — Telehealth: Payer: Medicare Other | Admitting: Neurology

## 2019-05-18 DIAGNOSIS — G43709 Chronic migraine without aura, not intractable, without status migrainosus: Secondary | ICD-10-CM | POA: Diagnosis not present

## 2019-05-18 DIAGNOSIS — D329 Benign neoplasm of meninges, unspecified: Secondary | ICD-10-CM

## 2019-05-18 DIAGNOSIS — IMO0002 Reserved for concepts with insufficient information to code with codable children: Secondary | ICD-10-CM

## 2019-05-18 MED ORDER — RIZATRIPTAN BENZOATE 10 MG PO TBDP: 10.0000 mg | ORAL_TABLET | ORAL | 6 refills | Status: DC | PRN

## 2019-05-18 MED ORDER — ONDANSETRON 4 MG PO TBDP: 4.0000 mg | ORAL_TABLET | Freq: Three times a day (TID) | ORAL | 6 refills | Status: DC | PRN

## 2019-05-18 NOTE — Progress Notes (Signed)
    Virtual Visit via Video Note  I connected with Heidi Evans on 05/18/19 at 12:30 PM EST by a video enabled telemedicine application and verified that I am speaking with the correct person using two identifiers.  Location: Patient: At her home  Provider: In the office    I discussed the limitations of evaluation and management by telemedicine and the availability of in person appointments. The patient expressed understanding and agreed to proceed.  History of Present Illness: Heidi Kappel Powersis a 63 year old female, seen in request byher primary care physician HeidiFoster, Heidi [place] Mfor evaluation of chronic headache, left neck pain, initial evaluation was on verbal 18 2019, she is accompanied by her husband at today's clinical visit.  I have reviewed and summarized the referring note from the referring physician.She had a past medical history of bipolar disorder, is under the care of psychiatrist Dr. Edwyna Evans, taking polypharmacy treatment, gabapentin 100 mg at bedtime, Trileptal 300 mg at bedtime, lamotrigine 400 mg a day,  She does have a history of chronic migraine headaches, previous migraine or retro-orbital area severe pounding headache with associated light noise sensitivity, nauseous, since 2015, she began to have daily headaches, woke up with holoacranial pressure headaches, also has light noise sensitivity, flashing light in her peripheral visual field, mild nauseous, lasting all day, she tried Tylenol without helping her symptoms.  In addition, she complains of left-sided neck pain, radiating pain to left shoulder, upper extremity, she denies trouble walking, left upper extremity sensorimotor deficit.  She has trouble sleeping,denies snoring, choking episodes.  UPDATE May 18 2019: Overall her migraine is under much better control now, she has stopped all her medications with exception of thyroid supplement, following her hospital admission in May 2020 for small bowel  obstruction, she was treated with conservative treatment,  She reported 2-3 migraines each month, responding to Maxalt 5 mg, but almost always just to 6-second dose, she denies side effects from Maxalt  Observations/Objective: She is awake, alert and oriented, speech is clear, somewhat scattered, facial symmetry noted, follows commands, moves 4 extremities without difficulties  I personally reviewed MRI of the brain with without contrast in February 2020,11 x 8 x 10 mm enhancing extra-axial mass in the pre-pontine cistern on the left.    It appears to be heavily calcified and is most consistent with a prepontine meningioma.  It is adjacent to the left trigeminal nerve as a nerve enters Meckel's cave without clearly distorting it. Some scattered T2/flair hyperintense foci in the hemispheres consistent with mild stable chronic microvascular ischemic change. No acute findings.   Assessment and Plan: 1.  Migraine headache 2.  Left prepontine meningioma, incidental MRI finding  Increase Maxalt to 10 mg as needed  May mix together with Zofran, Aleve as needed  Return to clinic in 6 months,  Advised patient to call clinic for visual change, facial paresthesia, may consider repeat MRI of the brain with without contrast  Follow Up Instructions: With Heidi Evans in 6 months   I provided 30 minutes of non-face-to-face time during this encounter.   Marcial Pacas, Evans.D. Ph.D.  [facility] [place], [place] [address] Phone: [phone] Fax:      [phone]

## 2020-01-30 ENCOUNTER — Inpatient Hospital Stay (HOSPITAL_COMMUNITY)
Admission: EM | Disposition: A | Discharge status: Home / Self Care | Payer: Self-pay | Source: Home / Self Care | Attending: Cardiovascular Disease

## 2020-01-30 ENCOUNTER — Other Ambulatory Visit: Payer: Self-pay

## 2020-01-30 ENCOUNTER — Inpatient Hospital Stay (HOSPITAL_COMMUNITY)
Admission: EM | Admit: 2020-01-30 | Discharge: 2020-02-01 | DRG: 287 | Disposition: A | Discharge status: Home / Self Care | Payer: Medicare Other | Attending: Cardiovascular Disease | Admitting: Cardiovascular Disease

## 2020-01-30 ENCOUNTER — Encounter (HOSPITAL_COMMUNITY): Payer: Self-pay | Admitting: Emergency Medicine

## 2020-01-30 ENCOUNTER — Emergency Department (HOSPITAL_COMMUNITY): Payer: Medicare Other

## 2020-01-30 DIAGNOSIS — E039 Hypothyroidism, unspecified: Secondary | ICD-10-CM | POA: Diagnosis present

## 2020-01-30 DIAGNOSIS — I249 Acute ischemic heart disease, unspecified: Secondary | ICD-10-CM | POA: Diagnosis not present

## 2020-01-30 DIAGNOSIS — M810 Age-related osteoporosis without current pathological fracture: Secondary | ICD-10-CM | POA: Diagnosis present

## 2020-01-30 DIAGNOSIS — Z86011 Personal history of benign neoplasm of the brain: Secondary | ICD-10-CM | POA: Diagnosis not present

## 2020-01-30 DIAGNOSIS — Z20822 Contact with and (suspected) exposure to covid-19: Secondary | ICD-10-CM | POA: Diagnosis present

## 2020-01-30 DIAGNOSIS — F419 Anxiety disorder, unspecified: Secondary | ICD-10-CM | POA: Diagnosis present

## 2020-01-30 DIAGNOSIS — I5181 Takotsubo syndrome: Principal | ICD-10-CM

## 2020-01-30 DIAGNOSIS — Z90710 Acquired absence of both cervix and uterus: Secondary | ICD-10-CM

## 2020-01-30 DIAGNOSIS — I361 Nonrheumatic tricuspid (valve) insufficiency: Secondary | ICD-10-CM | POA: Diagnosis not present

## 2020-01-30 DIAGNOSIS — I213 ST elevation (STEMI) myocardial infarction of unspecified site: Secondary | ICD-10-CM | POA: Diagnosis present

## 2020-01-30 DIAGNOSIS — R079 Chest pain, unspecified: Secondary | ICD-10-CM | POA: Diagnosis not present

## 2020-01-30 DIAGNOSIS — Z87891 Personal history of nicotine dependence: Secondary | ICD-10-CM | POA: Diagnosis not present

## 2020-01-30 DIAGNOSIS — Q245 Malformation of coronary vessels: Secondary | ICD-10-CM | POA: Diagnosis not present

## 2020-01-30 HISTORY — PX: LEFT HEART CATH AND CORONARY ANGIOGRAPHY: CATH118249

## 2020-01-30 LAB — LIPID PANEL
Cholesterol: 169 mg/dL (ref 0–200)
HDL: 88 mg/dL (ref >40)
LDL Cholesterol: 64 mg/dL (ref 0–99)
Total CHOL/HDL Ratio: 1.9 RATIO
Triglycerides: 86 mg/dL (ref <150)
VLDL: 17 mg/dL (ref 0–40)

## 2020-01-30 LAB — COMPREHENSIVE METABOLIC PANEL
ALT: 28 U/L (ref 0–44)
AST: 52 U/L — ABNORMAL HIGH (ref 15–41)
Albumin: 4.5 g/dL (ref 3.5–5.0)
Alkaline Phosphatase: 44 U/L (ref 38–126)
Anion gap: 18 — ABNORMAL HIGH (ref 5–15)
BUN: 9 mg/dL (ref 8–23)
CO2: 18 mmol/L — ABNORMAL LOW (ref 22–32)
Calcium: 9.6 mg/dL (ref 8.9–10.3)
Chloride: 99 mmol/L (ref 98–111)
Creatinine, Ser: 0.78 mg/dL (ref 0.44–1.00)
GFR calc Af Amer: >60 mL/min (ref >60)
GFR calc non Af Amer: >60 mL/min (ref >60)
Glucose, Bld: 152 mg/dL — ABNORMAL HIGH (ref 70–99)
Potassium: 3.3 mmol/L — ABNORMAL LOW (ref 3.5–5.1)
Sodium: 135 mmol/L (ref 135–145)
Total Bilirubin: 1.2 mg/dL (ref 0.3–1.2)
Total Protein: 6.6 g/dL (ref 6.5–8.1)

## 2020-01-30 LAB — CBC WITH DIFFERENTIAL/PLATELET
Abs Immature Granulocytes: 0.05 10*3/uL (ref 0.00–0.07)
Basophils Absolute: 0.1 10*3/uL (ref 0.0–0.1)
Basophils Relative: 0 %
Eosinophils Absolute: 0 10*3/uL (ref 0.0–0.5)
Eosinophils Relative: 0 %
HCT: 41.1 % (ref 36.0–46.0)
Hemoglobin: 13.6 g/dL (ref 12.0–15.0)
Immature Granulocytes: 0 %
Lymphocytes Relative: 9 %
Lymphs Abs: 1.3 10*3/uL (ref 0.7–4.0)
MCH: 30.4 pg (ref 26.0–34.0)
MCHC: 33.1 g/dL (ref 30.0–36.0)
MCV: 91.9 fL (ref 80.0–100.0)
Monocytes Absolute: 0.6 10*3/uL (ref 0.1–1.0)
Monocytes Relative: 4 %
Neutro Abs: 12.2 10*3/uL — ABNORMAL HIGH (ref 1.7–7.7)
Neutrophils Relative %: 87 %
Platelets: 276 10*3/uL (ref 150–400)
RBC: 4.47 MIL/uL (ref 3.87–5.11)
RDW: 12.6 % (ref 11.5–15.5)
WBC: 14.2 10*3/uL — ABNORMAL HIGH (ref 4.0–10.5)
nRBC: 0 % (ref 0.0–0.2)

## 2020-01-30 LAB — SARS CORONAVIRUS 2 BY RT PCR (HOSPITAL ORDER, PERFORMED IN ~~LOC~~ HOSPITAL LAB): SARS Coronavirus 2: NEGATIVE

## 2020-01-30 LAB — HEMOGLOBIN A1C
Hgb A1c MFr Bld: 5.3 % (ref 4.8–5.6)
Mean Plasma Glucose: 105.41 mg/dL

## 2020-01-30 LAB — APTT: aPTT: 27 seconds (ref 24–36)

## 2020-01-30 LAB — PROTIME-INR
INR: 1 (ref 0.8–1.2)
Prothrombin Time: 12.3 seconds (ref 11.4–15.2)

## 2020-01-30 SURGERY — LEFT HEART CATH AND CORONARY ANGIOGRAPHY
Anesthesia: LOCAL

## 2020-01-30 MED ORDER — ONDANSETRON HCL 4 MG/2ML IJ SOLN
4.0000 mg | Freq: Four times a day (QID) | INTRAMUSCULAR | Status: DC | PRN
  Administered 2020-01-30: 4 mg via INTRAVENOUS
  Filled 2020-01-30: qty 2

## 2020-01-30 MED ORDER — SODIUM CHLORIDE 0.9 % IV SOLN
INTRAVENOUS | Status: DC
  Administered 2020-01-30: 20 mL/h via INTRAVENOUS

## 2020-01-30 MED ORDER — LIDOCAINE HCL (PF) 1 % IJ SOLN
INTRAMUSCULAR | Status: AC
  Filled 2020-01-30: qty 30

## 2020-01-30 MED ORDER — NITROGLYCERIN 1 MG/10 ML FOR IR/CATH LAB
INTRA_ARTERIAL | Status: AC
  Filled 2020-01-30: qty 10

## 2020-01-30 MED ORDER — RIZATRIPTAN BENZOATE 10 MG PO TBDP: 10.0000 mg | ORAL_TABLET | ORAL | Status: DC | PRN

## 2020-01-30 MED ORDER — MIDAZOLAM HCL 2 MG/2ML IJ SOLN
INTRAMUSCULAR | Status: DC | PRN
  Administered 2020-01-30: 2 mg via INTRAVENOUS

## 2020-01-30 MED ORDER — ASPIRIN EC 81 MG PO TBEC: 81.0000 mg | DELAYED_RELEASE_TABLET | Freq: Every day | ORAL | Status: DC

## 2020-01-30 MED ORDER — ONDANSETRON HCL 4 MG/2ML IJ SOLN
INTRAMUSCULAR | Status: AC
  Administered 2020-01-30: 4 mg
  Filled 2020-01-30: qty 2

## 2020-01-30 MED ORDER — ENOXAPARIN SODIUM 40 MG/0.4ML ~~LOC~~ SOLN
40.0000 mg | SUBCUTANEOUS | Status: DC
  Administered 2020-01-31 – 2020-02-01 (×2): 40 mg via SUBCUTANEOUS
  Filled 2020-01-30 (×2): qty 0.4

## 2020-01-30 MED ORDER — AMLODIPINE BESYLATE 5 MG PO TABS
5.0000 mg | ORAL_TABLET | Freq: Every day | ORAL | Status: DC
  Filled 2020-01-30: qty 1

## 2020-01-30 MED ORDER — ONDANSETRON 4 MG PO TBDP
4.0000 mg | ORAL_TABLET | Freq: Three times a day (TID) | ORAL | Status: DC | PRN
  Filled 2020-01-30 (×2): qty 1

## 2020-01-30 MED ORDER — LIDOCAINE HCL (PF) 1 % IJ SOLN
INTRAMUSCULAR | Status: DC | PRN
  Administered 2020-01-30: 15 mL via SUBCUTANEOUS
  Administered 2020-01-30: 5 mL via SUBCUTANEOUS

## 2020-01-30 MED ORDER — ASPIRIN 81 MG PO CHEW: 324.0000 mg | CHEWABLE_TABLET | ORAL | Status: DC

## 2020-01-30 MED ORDER — LABETALOL HCL 5 MG/ML IV SOLN: 10.0000 mg | INTRAVENOUS | Status: AC | PRN

## 2020-01-30 MED ORDER — MIDAZOLAM HCL 2 MG/2ML IJ SOLN
INTRAMUSCULAR | Status: AC
  Filled 2020-01-30: qty 2

## 2020-01-30 MED ORDER — DIAZEPAM 5 MG PO TABS
5.0000 mg | ORAL_TABLET | ORAL | Status: DC | PRN
  Administered 2020-01-31 – 2020-02-01 (×3): 5 mg via ORAL
  Filled 2020-01-30 (×3): qty 1

## 2020-01-30 MED ORDER — CHLORHEXIDINE GLUCONATE CLOTH 2 % EX PADS: 6.0000 | MEDICATED_PAD | Freq: Every day | CUTANEOUS | Status: DC

## 2020-01-30 MED ORDER — ASPIRIN 81 MG PO CHEW
81.0000 mg | CHEWABLE_TABLET | Freq: Every day | ORAL | Status: DC
  Administered 2020-01-31 – 2020-02-01 (×2): 81 mg via ORAL
  Filled 2020-01-30 (×2): qty 1

## 2020-01-30 MED ORDER — HEPARIN (PORCINE) IN NACL 1000-0.9 UT/500ML-% IV SOLN
INTRAVENOUS | Status: AC
  Filled 2020-01-30: qty 1000

## 2020-01-30 MED ORDER — ATORVASTATIN CALCIUM 80 MG PO TABS
80.0000 mg | ORAL_TABLET | Freq: Every day | ORAL | Status: DC
  Filled 2020-01-30 (×2): qty 1

## 2020-01-30 MED ORDER — SODIUM CHLORIDE 0.9% FLUSH
3.0000 mL | Freq: Two times a day (BID) | INTRAVENOUS | Status: DC
  Administered 2020-01-31: 3 mL via INTRAVENOUS

## 2020-01-30 MED ORDER — ASPIRIN 81 MG PO CHEW: 324.0000 mg | CHEWABLE_TABLET | Freq: Once | ORAL | Status: DC

## 2020-01-30 MED ORDER — ACETAMINOPHEN 325 MG PO TABS
650.0000 mg | ORAL_TABLET | ORAL | Status: DC | PRN
  Administered 2020-01-31 – 2020-02-01 (×2): 650 mg via ORAL
  Filled 2020-01-30 (×2): qty 2

## 2020-01-30 MED ORDER — SODIUM CHLORIDE 0.9 % IV SOLN
INTRAVENOUS | Status: AC | PRN
  Administered 2020-01-30: 50 mL/h via INTRAVENOUS

## 2020-01-30 MED ORDER — VERAPAMIL HCL 2.5 MG/ML IV SOLN
INTRAVENOUS | Status: AC
  Filled 2020-01-30: qty 2

## 2020-01-30 MED ORDER — LEVOTHYROXINE SODIUM 88 MCG PO TABS: 88.0000 ug | ORAL_TABLET | Freq: Every day | ORAL | Status: DC

## 2020-01-30 MED ORDER — ATORVASTATIN CALCIUM 80 MG PO TABS: 80.0000 mg | ORAL_TABLET | Freq: Every day | ORAL | Status: DC

## 2020-01-30 MED ORDER — SODIUM CHLORIDE 0.9 % IV SOLN: INTRAVENOUS | Status: AC

## 2020-01-30 MED ORDER — ONDANSETRON HCL 4 MG/2ML IJ SOLN: 4.0000 mg | Freq: Four times a day (QID) | INTRAMUSCULAR | Status: DC | PRN

## 2020-01-30 MED ORDER — ONDANSETRON HCL 4 MG/2ML IJ SOLN
INTRAMUSCULAR | Status: DC | PRN
  Administered 2020-01-30: 4 mg via INTRAVENOUS

## 2020-01-30 MED ORDER — GABAPENTIN 100 MG PO CAPS
200.0000 mg | ORAL_CAPSULE | Freq: Every evening | ORAL | Status: DC | PRN
  Administered 2020-01-31: 200 mg via ORAL
  Filled 2020-01-30: qty 2

## 2020-01-30 MED ORDER — DIAZEPAM 5 MG PO TABS: 5.0000 mg | ORAL_TABLET | Freq: Two times a day (BID) | ORAL | Status: DC | PRN

## 2020-01-30 MED ORDER — FENTANYL CITRATE (PF) 100 MCG/2ML IJ SOLN
INTRAMUSCULAR | Status: DC | PRN
Start: 2020-01-30 — End: 2020-01-30
  Administered 2020-01-30: 25 ug via INTRAVENOUS

## 2020-01-30 MED ORDER — IOHEXOL 350 MG/ML SOLN
INTRAVENOUS | Status: DC | PRN
  Administered 2020-01-30: 65 mL via INTRA_ARTERIAL

## 2020-01-30 MED ORDER — HYDRALAZINE HCL 20 MG/ML IJ SOLN: 10.0000 mg | INTRAMUSCULAR | Status: AC | PRN

## 2020-01-30 MED ORDER — HEPARIN SODIUM (PORCINE) 5000 UNIT/ML IJ SOLN
3000.0000 [IU] | Freq: Once | INTRAMUSCULAR | Status: AC
  Administered 2020-01-30: 3000 [IU] via INTRAVENOUS

## 2020-01-30 MED ORDER — NITROGLYCERIN 0.4 MG SL SUBL: 0.4000 mg | SUBLINGUAL_TABLET | SUBLINGUAL | Status: DC | PRN

## 2020-01-30 MED ORDER — FENTANYL CITRATE (PF) 100 MCG/2ML IJ SOLN
INTRAMUSCULAR | Status: AC
  Filled 2020-01-30: qty 2

## 2020-01-30 MED ORDER — SODIUM CHLORIDE 0.9 % IV SOLN: 250.0000 mL | INTRAVENOUS | Status: DC | PRN

## 2020-01-30 MED ORDER — ONDANSETRON HCL 4 MG/2ML IJ SOLN
INTRAMUSCULAR | Status: AC
  Filled 2020-01-30: qty 2

## 2020-01-30 MED ORDER — SODIUM CHLORIDE 0.9% FLUSH: 3.0000 mL | INTRAVENOUS | Status: DC | PRN

## 2020-01-30 MED ORDER — LEVOTHYROXINE SODIUM 100 MCG PO TABS: 100.0000 ug | ORAL_TABLET | Freq: Every day | ORAL | Status: DC

## 2020-01-30 MED ORDER — HEPARIN SODIUM (PORCINE) 1000 UNIT/ML IJ SOLN
INTRAMUSCULAR | Status: AC
  Filled 2020-01-30: qty 1

## 2020-01-30 MED ORDER — ACETAMINOPHEN 325 MG PO TABS: 650.0000 mg | ORAL_TABLET | ORAL | Status: DC | PRN

## 2020-01-30 MED ORDER — ERENUMAB-AOOE 70 MG/ML ~~LOC~~ SOAJ: 70.0000 mg | SUBCUTANEOUS | Status: DC

## 2020-01-30 MED ORDER — ASPIRIN 300 MG RE SUPP: 300.0000 mg | RECTAL | Status: DC

## 2020-01-30 SURGICAL SUPPLY — 13 items
CATH INFINITI 5FR MULTPACK ANG (CATHETERS) ×2 IMPLANT
CATH OPTITORQUE TIG 4.0 5F (CATHETERS) ×2 IMPLANT
GLIDESHEATH SLEND SS 6F .021 (SHEATH) ×2 IMPLANT
GUIDEWIRE INQWIRE 1.5J.035X260 (WIRE) ×1 IMPLANT
INQWIRE 1.5J .035X260CM (WIRE) ×2
KIT ENCORE 26 ADVANTAGE (KITS) ×2 IMPLANT
KIT HEART LEFT (KITS) ×2 IMPLANT
PACK CARDIAC CATHETERIZATION (CUSTOM PROCEDURE TRAY) ×2 IMPLANT
SHEATH PINNACLE 6F 10CM (SHEATH) ×2 IMPLANT
SHEATH PROBE COVER 6X72 (BAG) ×2 IMPLANT
TRANSDUCER W/STOPCOCK (MISCELLANEOUS) ×2 IMPLANT
TUBING CIL FLEX 10 FLL-RA (TUBING) ×2 IMPLANT
WIRE EMERALD 3MM-J .035X150CM (WIRE) ×2 IMPLANT

## 2020-01-30 NOTE — ED Notes (Signed)
Working on giving IV access

## 2020-01-30 NOTE — ED Notes (Signed)
STEMI pads placed, clothing removed.

## 2020-01-30 NOTE — ED Notes (Signed)
Cards repaged

## 2020-01-30 NOTE — ED Provider Notes (Signed)
[facility] CATH LAB Provider Note   CSN: [id number] Arrival date & time: 01/30/20  2102     History Chief Complaint  Patient presents with  . Code STEMI    Heidi Evans is a 64 y.o. female.  HPI   Patient presents as a code STEMI. She arrives via EMS and history is obtained by those individuals.  On the patient herself provides details, as well, but is a poor historian. Patient was seemingly in her usual state of health until earlier in the morning, today, when she received unsettling news. Soon thereafter she developed chest pressure.  Since that time she has had persistent discomfort, though the pressure has changed and now she has a heart squeezing sensation as well as sensation of electricity going in her anterior thorax. No dyspnea, however there is weakness and diaphoresis. Minimal relief with aspirin. EMS notes the patient was tachycardic, diaphoretic, but awake and alert on arrival.  She remained so throughout.  On the transmitted EKG, which I was aware of prior to the patient's arrival.   Past Medical History:  Diagnosis Date  . Acid reflux   . Anxiety   . Depression   . Hypothyroidism   . Migraine   . Osteoporosis   . Vitamin D deficiency     Patient Active Problem List   Diagnosis Date Noted  . STEMI (ST elevation myocardial infarction) ([place]) 01/30/2020  . ACS (acute coronary syndrome) ([place])   . Chronic migraine 05/18/2019  . Meningioma ([place]) 05/18/2019  . SBO (small bowel obstruction) ([place]) 10/22/2018  . Acute lower UTI 10/22/2018  . Depression 10/22/2018  . Hypercholesteremia 04/19/2018  . Hypothyroidism 04/19/2018  . Daily headache 04/19/2018  . Neck pain on left side 04/19/2018    Past Surgical History:  Procedure Laterality Date  . ABDOMINAL HYSTERECTOMY     total  . BREAST REDUCTION SURGERY  2011   Dr. Towanda Malkin  . NASAL SEPTUM SURGERY  1977  . NISSEN FUNDOPLICATION  3710   Dr. Johney Maine     OB History   No  obstetric history on file.     Family History  Problem Relation Age of Onset  . Migraines Mother   . Colon cancer Father   . Headache Other        brain tumor  . Bipolar disorder Other        "in my family"  . Mental illness Other        "for different ones"    Social History   Tobacco Use  . Smoking status: Former Smoker    Types: Cigarettes    Quit date: 1994    Years since quitting: 27.6  . Smokeless tobacco: Never Used  Vaping Use  . Vaping Use: Never used  Substance Use Topics  . Alcohol use: Yes    Comment: rare 1-2 times per year  . Drug use: Not Currently    Types: Marijuana    Comment: in the past    Home Medications Prior to Admission medications   Medication Sig Start Date End Date Taking? Authorizing Provider  diazepam (VALIUM) 5 MG tablet Take 5 mg by mouth 2 (two) times daily as needed for anxiety.    [provider]  Erenumab-aooe (AIMOVIG) 70 MG/ML SOAJ Inject 70 mg into the skin every 30 (thirty) days. 07/19/18   Suzzanne Cloud, NP  gabapentin (NEURONTIN) 100 MG capsule Take 200 mg by mouth at bedtime.  03/09/18   [provider]  levothyroxine (SYNTHROID, LEVOTHROID) 100 MCG tablet Take 100 mcg by mouth daily. Alternate with levothyroxine 88 mcg 03/24/18   [provider]  levothyroxine (SYNTHROID, LEVOTHROID) 88 MCG tablet Take 88 mcg by mouth daily before breakfast. Alternate with levothyroxine 100 mcg    [provider]  ondansetron (ZOFRAN ODT) 4 MG disintegrating tablet Take 1 tablet (4 mg total) by mouth every 8 (eight) hours as needed. 05/18/19   Marcial Pacas, MD  rizatriptan (MAXALT-MLT) 10 MG disintegrating tablet Take 1 tablet (10 mg total) by mouth as needed. May repeat in 2 hours if needed, no refill in less than 30 days 05/18/19   Marcial Pacas, MD    Allergies    Codeine and Other  Review of Systems   Review of Systems  Constitutional:       Per HPI, otherwise negative  HENT:       Per HPI, otherwise  negative  Respiratory:       Per HPI, otherwise negative  Cardiovascular:       Denies prior chest pain, cardiac history  Gastrointestinal: Negative for vomiting.  Endocrine:       Negative aside from HPI  Genitourinary:       Neg aside from HPI   Musculoskeletal:       Per HPI, otherwise negative  Skin: Negative.   Neurological: Negative for syncope.  Psychiatric/Behavioral: The patient is nervous/anxious.     Physical Exam Updated Vital Signs BP 117/82   Pulse 66   Temp 97.6 F (36.4 C) (Temporal)   Resp 16   Ht 5\' 2"  (1.575 m)   Wt 47.6 kg   SpO2 100%   BMI 19.20 kg/m   Physical Exam Vitals and nursing note reviewed.  Constitutional:      Appearance: She is well-developed. She is ill-appearing and diaphoretic.  HENT:     Head: Normocephalic and atraumatic.  Eyes:     Conjunctiva/sclera: Conjunctivae normal.  Cardiovascular:     Rate and Rhythm: Regular rhythm. Tachycardia present.  Pulmonary:     Effort: Pulmonary effort is normal. No respiratory distress.     Breath sounds: Normal breath sounds. No stridor.  Abdominal:     General: There is no distension.  Skin:    General: Skin is warm.  Neurological:     Mental Status: She is alert and oriented to person, place, and time.     Cranial Nerves: No cranial nerve deficit.  Psychiatric:        Mood and Affect: Mood is anxious.     ED Results / Procedures / Treatments   Labs (all labs ordered are listed, but only abnormal results are displayed) Labs Reviewed  CBC WITH DIFFERENTIAL/PLATELET - Abnormal; Notable for the following components:      Result Value   WBC 14.2 (*)    Neutro Abs 12.2 (*)    All other components within normal limits  COMPREHENSIVE METABOLIC PANEL - Abnormal; Notable for the following components:   Potassium 3.3 (*)    CO2 18 (*)    Glucose, Bld 152 (*)    AST 52 (*)    Anion gap 18 (*)    All other components within normal limits  TROPONIN I (HIGH SENSITIVITY) - Abnormal;  Notable for the following components:   Troponin I (High Sensitivity) 4,097 (*)    All other components within normal limits  SARS CORONAVIRUS 2 BY RT PCR (HOSPITAL ORDER, [facility])  HEMOGLOBIN A1C  PROTIME-INR  APTT  LIPID PANEL  HIV ANTIBODY (ROUTINE TESTING W REFLEX)  BASIC METABOLIC PANEL  CBC  LIPID PANEL  TROPONIN I (HIGH SENSITIVITY)   EMS rhythm strip, diffuse ST elevations   EKG EKG Interpretation  Date/Time:  Monday January 30 2020 21:07:14 EDT Ventricular Rate:  66 PR Interval:    QRS Duration: 107 QT Interval:  471 QTC Calculation: 494 R Axis:   69 Text Interpretation: Sinus rhythm diffuse st t wave changes Baseline wander Artifact Abnormal ECG Confirmed by Carmin Muskrat [phone]) on 01/30/2020 9:09:53 PM   Radiology DG Chest Port 1 View  Result Date: 01/30/2020 CLINICAL DATA:  Chest pain EXAM: PORTABLE CHEST 1 VIEW COMPARISON:  None. FINDINGS: The heart size and mediastinal contours are within normal limits. Both lungs are clear. The visualized skeletal structures are unremarkable. IMPRESSION: No active disease. Electronically Signed   By: Prudencio Pair M.D.   On: 01/30/2020 21:45    Procedures Procedures (including critical care time)  Medications Ordered in ED Medications  0.9 %  sodium chloride infusion (20 mL/hr Intravenous New Bag/Given 01/30/20 2117)  aspirin chewable tablet 324 mg ( Oral MAR Hold 01/30/20 2139)  0.9 %  sodium chloride infusion (50 mL/hr Intravenous New Bag/Given 01/30/20 2153)  lidocaine (PF) (XYLOCAINE) 1 % injection (15 mLs Subcutaneous Given 01/30/20 2200)  ondansetron (ZOFRAN) injection (4 mg Intravenous Given 01/30/20 2157)  midazolam (VERSED) injection (2 mg Intravenous Given 01/30/20 2157)  fentaNYL (SUBLIMAZE) injection (25 mcg Intravenous Given 01/30/20 2157)  iohexol (OMNIPAQUE) 350 MG/ML injection (65 mLs Intra-arterial Given 01/30/20 2225)  aspirin chewable tablet 324 mg (has no administration in time  range)    Or  aspirin suppository 300 mg (has no administration in time range)  aspirin EC tablet 81 mg (has no administration in time range)  nitroGLYCERIN (NITROSTAT) SL tablet 0.4 mg (has no administration in time range)  acetaminophen (TYLENOL) tablet 650 mg (has no administration in time range)  ondansetron (ZOFRAN) injection 4 mg (has no administration in time range)  atorvastatin (LIPITOR) tablet 80 mg (has no administration in time range)  diazepam (VALIUM) tablet 5 mg (has no administration in time range)  Erenumab-aooe SOAJ 70 mg (has no administration in time range)  gabapentin (NEURONTIN) capsule 200 mg (has no administration in time range)  levothyroxine (SYNTHROID) tablet 100 mcg (has no administration in time range)  levothyroxine (SYNTHROID) tablet 88 mcg (has no administration in time range)  ondansetron (ZOFRAN-ODT) disintegrating tablet 4 mg (has no administration in time range)  rizatriptan (MAXALT-MLT) disintegrating tablet 10 mg (has no administration in time range)  heparin injection 3,000 Units (3,000 Units Intravenous Given 01/30/20 2116)  ondansetron (ZOFRAN) 4 MG/2ML injection (4 mg  Given 01/30/20 2125)    ED Course  I have reviewed the triage vital signs and the nursing notes.  Pertinent labs & imaging results that were available during my care of the patient were reviewed by me and considered in my medical decision making (see chart for details).    MDM Rules/Calculators/A&P                         After initial evaluation with consideration of STEMI, the patient received heparin, per protocol, was placed on continuous monitoring. I discussed her case with our cardiology colleague, Dr. Claiborne Billings. With consideration of new EKG changes, chest pain, she was taken to the catheterization lab emergently.    Now on chart review, clear the patient had a troponin  of greater than 4000, consistent with NSTEMI versus acute occlusion, and in reading the patient's  catheterization report, there is some suspicion for vasospasm, with reduction 70% during systole. Final Clinical Impression(s) / ED Diagnoses Final diagnoses:  ACS (acute coronary syndrome) ([place])   CRITICAL CARE Performed by: Carmin Muskrat Total critical care time: 35 minutes Critical care time was exclusive of separately billable procedures and treating other patients. Critical care was necessary to treat or prevent imminent or life-threatening deterioration. Critical care was time spent personally by me on the following activities: development of treatment plan with patient and/or surrogate as well as nursing, discussions with consultants, evaluation of patient's response to treatment, examination of patient, obtaining history from patient or surrogate, ordering and performing treatments and interventions, ordering and review of laboratory studies, ordering and review of radiographic studies, pulse oximetry and re-evaluation of patient's condition.    Carmin Muskrat, MD 01/30/20 [phone]

## 2020-01-30 NOTE — ED Notes (Signed)
Cath lab ready, pt in route

## 2020-01-30 NOTE — ED Notes (Signed)
Dr. Claiborne Billings bedside

## 2020-01-30 NOTE — ED Triage Notes (Signed)
Per EMS, from home pt was home received some bad news and shortly later began having chest pain, became cool, pale and anxious.  "feels electric and arms are tingling."  Hx of anxiety.  Give 324 ASA  124/90 Hr 80 100% RA

## 2020-01-31 ENCOUNTER — Inpatient Hospital Stay (HOSPITAL_COMMUNITY): Payer: Medicare Other

## 2020-01-31 ENCOUNTER — Encounter (HOSPITAL_COMMUNITY): Payer: Self-pay | Admitting: Cardiovascular Disease

## 2020-01-31 DIAGNOSIS — R079 Chest pain, unspecified: Secondary | ICD-10-CM

## 2020-01-31 DIAGNOSIS — I361 Nonrheumatic tricuspid (valve) insufficiency: Secondary | ICD-10-CM

## 2020-01-31 LAB — ECHOCARDIOGRAM COMPLETE
Area-P 1/2: 4.8 cm2
Calc EF: 37.2 %
Height: 62 in
Single Plane A2C EF: 22.8 %
Single Plane A4C EF: 48.1 %
Weight: 1760.15 oz

## 2020-01-31 LAB — BASIC METABOLIC PANEL
Anion gap: 13 (ref 5–15)
BUN: 9 mg/dL (ref 8–23)
CO2: 24 mmol/L (ref 22–32)
Calcium: 9 mg/dL (ref 8.9–10.3)
Chloride: 100 mmol/L (ref 98–111)
Creatinine, Ser: 0.75 mg/dL (ref 0.44–1.00)
GFR calc Af Amer: >60 mL/min (ref >60)
GFR calc non Af Amer: >60 mL/min (ref >60)
Glucose, Bld: 146 mg/dL — ABNORMAL HIGH (ref 70–99)
Potassium: 3.6 mmol/L (ref 3.5–5.1)
Sodium: 137 mmol/L (ref 135–145)

## 2020-01-31 LAB — CBC
HCT: 36.4 % (ref 36.0–46.0)
Hemoglobin: 12.4 g/dL (ref 12.0–15.0)
MCH: 30.9 pg (ref 26.0–34.0)
MCHC: 34.1 g/dL (ref 30.0–36.0)
MCV: 90.8 fL (ref 80.0–100.0)
Platelets: 253 10*3/uL (ref 150–400)
RBC: 4.01 MIL/uL (ref 3.87–5.11)
RDW: 12.7 % (ref 11.5–15.5)
WBC: 11.5 10*3/uL — ABNORMAL HIGH (ref 4.0–10.5)
nRBC: 0 % (ref 0.0–0.2)

## 2020-01-31 LAB — LIPID PANEL
Cholesterol: 153 mg/dL (ref 0–200)
HDL: 84 mg/dL (ref >40)
LDL Cholesterol: 58 mg/dL (ref 0–99)
Total CHOL/HDL Ratio: 1.8 RATIO
Triglycerides: 53 mg/dL (ref <150)
VLDL: 11 mg/dL (ref 0–40)

## 2020-01-31 LAB — TROPONIN I (HIGH SENSITIVITY)
Troponin I (High Sensitivity): 4097 ng/L (ref <18)
Troponin I (High Sensitivity): 3602 ng/L (ref <18)
Troponin I (High Sensitivity): 3665 ng/L (ref <18)

## 2020-01-31 LAB — HIV ANTIBODY (ROUTINE TESTING W REFLEX): HIV Screen 4th Generation wRfx: NONREACTIVE

## 2020-01-31 LAB — MRSA PCR SCREENING: MRSA by PCR: NEGATIVE

## 2020-01-31 LAB — POCT ACTIVATED CLOTTING TIME: Activated Clotting Time: 136 seconds

## 2020-01-31 MED ORDER — CHLORHEXIDINE GLUCONATE CLOTH 2 % EX PADS: 6.0000 | MEDICATED_PAD | Freq: Every day | CUTANEOUS | Status: DC

## 2020-01-31 MED ORDER — LEVOTHYROXINE SODIUM 88 MCG PO TABS: 88.0000 ug | ORAL_TABLET | Freq: Every day | ORAL | Status: DC

## 2020-01-31 MED ORDER — THYROID 60 MG PO TABS
60.0000 mg | ORAL_TABLET | Freq: Every day | ORAL | Status: DC
  Administered 2020-01-31 – 2020-02-01 (×2): 60 mg via ORAL
  Filled 2020-01-31 (×2): qty 1

## 2020-01-31 MED ORDER — CARVEDILOL 3.125 MG PO TABS
3.1250 mg | ORAL_TABLET | Freq: Two times a day (BID) | ORAL | Status: DC
  Administered 2020-01-31 – 2020-02-01 (×3): 3.125 mg via ORAL
  Filled 2020-01-31 (×3): qty 1

## 2020-01-31 MED ORDER — ALPRAZOLAM 0.25 MG PO TABS
0.2500 mg | ORAL_TABLET | Freq: Three times a day (TID) | ORAL | Status: DC | PRN
  Administered 2020-01-31: 0.25 mg via ORAL
  Filled 2020-01-31: qty 1

## 2020-01-31 MED ORDER — ENSURE ENLIVE PO LIQD
237.0000 mL | Freq: Two times a day (BID) | ORAL | Status: DC
  Administered 2020-01-31 – 2020-02-01 (×3): 237 mL via ORAL

## 2020-01-31 MED ORDER — CYCLOBENZAPRINE HCL 10 MG PO TABS: 10.0000 mg | ORAL_TABLET | Freq: Every evening | ORAL | Status: DC | PRN

## 2020-01-31 MED FILL — Heparin Sod (Porcine)-NaCl IV Soln 1000 Unit/500ML-0.9%: INTRAVENOUS | Qty: 1000 | Status: AC

## 2020-01-31 MED FILL — Nitroglycerin IV Soln 100 MCG/ML in D5W: INTRA_ARTERIAL | Qty: 10 | Status: AC

## 2020-01-31 MED FILL — Verapamil HCl IV Soln 2.5 MG/ML: INTRAVENOUS | Qty: 2 | Status: AC

## 2020-01-31 NOTE — Progress Notes (Signed)
  Echocardiogram 2D Echocardiogram with definity has been performed.  Darlina Sicilian M 01/31/2020, 9:19 AM

## 2020-01-31 NOTE — Progress Notes (Signed)
CARDIAC REHAB PHASE I   PRE:  Rate/Rhythm: 73 SR    BP: sitting 102/63    SaO2:   MODE:  Ambulation: 470 ft   POST:  Rate/Rhythm: 73 SR    BP: sitting 106/77     SaO2: 97 RA  Tolerated well, no c/o SOB or CP. Pt smiling, interactive. VSS, sts BP generally runs low. Discussed MI/Takotsubo, restrictions, exercise, stress relief, and CRPII. She is receptive and has actually been undergoing stress management and counseling for a while. She has had considerable stress and grief in the past year. Encouraged exercise. Will refer to [place], ACSM 01/31/2020 3:15 PM

## 2020-01-31 NOTE — H&P (Addendum)
Cardiology Admission History and Physical:   Patient ID: Heidi Evans MRN: [id number]; DOB: 1955-10-06   Admission date: 01/30/2020  Primary Care Provider: Sherald Hess., MD [facility] Cardiologist:   Chief Complaint:  Pt presented to [facility] for [facility]   Patient Profile:   Heidi Evans is a 64 y.o. female with who presents for chest pressure   History of Present Illness:   Heidi Evans is a 64 yo with no prior cardiac history    Today she received some disturbing news  Developed chest pressure  Squeezing.  No SOB   +Weak   + diaphoretic.   No relief with ASA  EMS called  Per their note pt was tachycardic, diaphoretic.    EKG done by EMS and COde STEMI activated  In ED, EKG showed SR 66 bpm  1-2 mm ST elevation in inferior and lateral leads Trop 4097   Pt underwent cath which showed nonstenotic Ostial L Cx/prox Cx.  Notw some spasm.     Some angulation consistent with bridging in proximal portion of the  artery   LAD and RCA OK   No intervention done   Pt started on amlodipine 5 mg and sent to CCU   The pt currently deneis CP   Breathing is OK She is VERY anxious  Tearful    Past Medical History:  Diagnosis Date   Acid reflux    Anxiety    Depression    Hypothyroidism    Migraine    Osteoporosis    Vitamin D deficiency     Past Surgical History:  Procedure Laterality Date   ABDOMINAL HYSTERECTOMY     total   BREAST REDUCTION SURGERY  2011   Dr. Towanda Malkin   NASAL SEPTUM SURGERY  3785   NISSEN FUNDOPLICATION  8850   Dr. Johney Maine     Medications Prior to Admission: Prior to Admission medications   Medication Sig Start Date End Date Taking? Authorizing Provider  diazepam (VALIUM) 5 MG tablet Take 5 mg by mouth 2 (two) times daily as needed for anxiety.    [provider]  Erenumab-aooe (AIMOVIG) 70 MG/ML SOAJ Inject 70 mg into the skin every 30 (thirty) days. 07/19/18   Suzzanne Cloud, NP  gabapentin (NEURONTIN) 100 MG capsule Take 200  mg by mouth at bedtime.  03/09/18   [provider]  levothyroxine (SYNTHROID, LEVOTHROID) 100 MCG tablet Take 100 mcg by mouth daily. Alternate with levothyroxine 88 mcg 03/24/18   [provider]  levothyroxine (SYNTHROID, LEVOTHROID) 88 MCG tablet Take 88 mcg by mouth daily before breakfast. Alternate with levothyroxine 100 mcg    [provider]  ondansetron (ZOFRAN ODT) 4 MG disintegrating tablet Take 1 tablet (4 mg total) by mouth every 8 (eight) hours as needed. 05/18/19   Marcial Pacas, MD  rizatriptan (MAXALT-MLT) 10 MG disintegrating tablet Take 1 tablet (10 mg total) by mouth as needed. May repeat in 2 hours if needed, no refill in less than 30 days 05/18/19   Marcial Pacas, MD     Allergies:    Allergies  Allergen Reactions   Codeine Itching   Other Other (See Comments)    Some anesthesia medications cause her nausea (unknown which)    Social History:   Social History   Socioeconomic History   Marital status: Married    Spouse name: Not on file   Number of children: 1   Years of education: h.s. plus insurance vocational  Highest education level: High school graduate  Occupational History   Not on file  Tobacco Use   Smoking status: Former Smoker    Types: Cigarettes    Quit date: 1994    Years since quitting: 27.6   Smokeless tobacco: Never Used  Scientific laboratory technician Use: Never used  Substance and Sexual Activity   Alcohol use: Yes    Comment: rare 1-2 times per year   Drug use: Not Currently    Types: Marijuana    Comment: in the past   Sexual activity: Not on file  Other Topics Concern   Not on file  Social History Narrative   Lives at home with spouse   Right handed   Caffeine: 1.5 cups daily   Social Determinants of Health   Financial Resource Strain:    Difficulty of Paying Living Expenses: Not on file  Food Insecurity:    Worried About Charity fundraiser in the Last Year: Not on file   [employer] of Food in the  Last Year: Not on file  Transportation Needs:    Lack of Transportation (Medical): Not on file   Lack of Transportation (Non-Medical): Not on file  Physical Activity:    Days of Exercise per Week: Not on file   Minutes of Exercise per Session: Not on file  Stress:    Feeling of Stress : Not on file  Social Connections:    Frequency of Communication with Friends and Family: Not on file   Frequency of Social Gatherings with Friends and Family: Not on file   Attends Religious Services: Not on file   Active Member of [place] or Organizations: Not on file   Attends Archivist Meetings: Not on file   Marital Status: Not on file  Intimate Partner Violence:    Fear of Current or Ex-Partner: Not on file   Emotionally Abused: Not on file   Physically Abused: Not on file   Sexually Abused: Not on file    Family History:   The patient's family history includes Bipolar disorder in an other family member; Colon cancer in her father; Headache in an other family member; Mental illness in an other family member; Migraines in her mother.    ROS:  Please see the history of present illness.  All other ROS reviewed and negative.     Physical Exam/Data:   Vitals:   01/30/20 2229 01/30/20 2234 01/30/20 2239 01/30/20 2244  BP: 124/64 128/65 127/75   Pulse: 63 71 76 70  Resp: (!) 29 (!) 26 14 20   Temp:      TempSrc:      SpO2: 96% 96% 95% 97%  Weight:      Height:       No intake or output data in the 24 hours ending 01/31/20 0015 Last 3 Weights 01/30/2020 10/25/2018 10/24/2018  Weight (lbs) 105 lb 126 lb 9.6 oz 140 lb 10.5 oz  Weight (kg) 47.628 kg 57.425 kg 63.8 kg     Body mass index is 19.2 kg/m.  General:  Thin 64 yo  in no acute distress HEENT: normal Lymph: no adenopathy Neck: no JVD  No bruits   Endocrine:  No thryomegaly Vascular: No carotid bruits; FA pulses 2+ bilaterally without bruits  Cardiac:  normal S1, S2; RRR; no murmur  Lungs:  clear to  auscultation bilaterally, no wheezing, rhonchi or rales  Abd: soft, nontender, no hepatomegaly  Ext: noLE  edema Musculoskeletal:  No deformities,  BUE and BLE strength normal and equal Skin: warm and dry  Neuro:  CNs 2-12 intact, no focal abnormalities noted Psych:  Normal affect    EKG:  The ECG that was done in ED was personally reviewed and demonstrates SR 66  ST elevation in inferior and lateral leads   Relevant CV Studies:  Cardiac cath   Non-stenotic Ost Cx to Prox Cx lesion.   Acute coronary syndrome most likely secondary to a component of vasospasm in the very proximal circumflex vessel.  There appears to be angulated systolic muscle bridging of the very proximal circumflex narrowing to 70% during systole on a sharp bend in an otherwise normal-appearing circumflex vessel with significant vessel tortuosity of the obtuse marginal branches.  The LAD is a large normal vessel which wraps around the apex.  The RCA is angiographically normal.  Low normal global LV function with EF estimated at 50% without definitive wall motion abnormality.  LVEDP is increased to 24 mm.  RECOMMENDATION: Initial troponin level 4097.  The patient will be transported to [facility]  She will be started on amlodipine 5 mg to reduce potential vasospasm.  A 2D echo Doppler study will be done in a.m.  Serial troponins will be obtained.  Due to her significant increased stress contributing to today's symptoms, she may require antianxiolytic therapy.    Laboratory Data:  High Sensitivity Troponin:   Recent Labs  Lab 01/30/20 2112  TROPONINIHS 4,097*      Chemistry Recent Labs  Lab 01/30/20 2112  NA 135  K 3.3*  CL 99  CO2 18*  GLUCOSE 152*  BUN 9  CREATININE 0.78  CALCIUM 9.6  GFRNONAA >60  GFRAA >60  ANIONGAP 18*    Recent Labs  Lab 01/30/20 2112  PROT 6.6  ALBUMIN 4.5  AST 52*  ALT 28  ALKPHOS 44  BILITOT 1.2   Hematology Recent Labs  Lab 01/30/20 2112  WBC 14.2*  RBC 4.47  HGB  13.6  HCT 41.1  MCV 91.9  MCH 30.4  MCHC 33.1  RDW 12.6  PLT 276   BNPNo results for input(s): BNP, PROBNP in the last 168 hours.  DDimer No results for input(s): DDIMER in the last 168 hours.   Radiology/Studies:  CARDIAC CATHETERIZATION  Result Date: 01/30/2020  Non-stenotic Ost Cx to Prox Cx lesion.  Acute coronary syndrome most likely secondary to a component of vasospasm in the very proximal circumflex vessel.  There appears to be angulated systolic muscle bridging of the very proximal circumflex narrowing to 70% during systole on a sharp bend in an otherwise normal-appearing circumflex vessel with significant vessel tortuosity of the obtuse marginal branches.  The LAD is a large normal vessel which wraps around the apex.  The RCA is angiographically normal. Low normal global LV function with EF estimated at 50% without definitive wall motion abnormality.  LVEDP is increased to 24 mm. RECOMMENDATION: Initial troponin level 4097.  The patient will be transported to [facility].  She will be started on amlodipine 5 mg to reduce potential vasospasm.  A 2D echo Doppler study will be done in a.m.  Serial troponins will be obtained.  Due to her significant increased stress contributing to today's symptoms, she may require antianxiolytic therapy.   DG Chest Port 1 View  Result Date: 01/30/2020 CLINICAL DATA:  Chest pain EXAM: PORTABLE CHEST 1 VIEW COMPARISON:  None. FINDINGS: The heart size and mediastinal contours are within normal limits. Both lungs are clear. The visualized skeletal structures are  unremarkable. IMPRESSION: No active disease. Electronically Signed   By: Prudencio Pair M.D.   On: 01/30/2020 21:45     Assessment and Plan:   1  Chest pain  Cath as noted above  ? Due to spasm     Will reivew films Currently without CP    WIll continue to cycle troponins     Continue amlodipine  WIll get echo tomorrow  Consider CT to evaluate LCx further (?bridge)  2  LIpids   Check in AM  Start  statin empirically  3  Psych  PT very anxious   Has had some help wit hXanax in past  WIll Rx this evening   4  Hypothyroidism  Continue meds     5  Hx migraines.  6  Hx meningioma Seen on MRI    For questions or updates, please contact [place] Please consult [email or web address] for contact info under     Signed, Dorris Carnes, MD  01/31/2020 12:15 AM

## 2020-01-31 NOTE — Progress Notes (Signed)
Cardiology Note: Admitted by Dr Harrington Challenger just after midnight. Feeling much better. No further CP or dyspnea. Now just feels tired.   Reviewed cath and echo images. Typical presentation of Acute Takotsubo Syndrome. Trop elevated to 4097 trending down to 3665 this am. Echo with typical large periapical wall motion abnormality, vigorous basal LV function. I think the angulation of the proximal LCx is an incidental finding not related to her presentation. Rhythm stable, start carvedilol, transfer to tele bed today. Anticipate DC home tomorrow if no complications arise and outpatient echo follow-up 8-12 weeks.  Heidi Evans 01/31/2020 10:25 AM

## 2020-02-01 ENCOUNTER — Telehealth: Payer: Self-pay

## 2020-02-01 DIAGNOSIS — Q245 Malformation of coronary vessels: Secondary | ICD-10-CM

## 2020-02-01 DIAGNOSIS — I5181 Takotsubo syndrome: Principal | ICD-10-CM

## 2020-02-01 MED ORDER — NITROGLYCERIN 0.4 MG SL SUBL: 0.4000 mg | SUBLINGUAL_TABLET | SUBLINGUAL | 3 refills | Status: AC | PRN

## 2020-02-01 MED ORDER — CARVEDILOL 3.125 MG PO TABS
3.1250 mg | ORAL_TABLET | Freq: Two times a day (BID) | ORAL | 3 refills | Status: DC
Start: 2020-02-01 — End: 2020-02-24

## 2020-02-01 MED ORDER — ATORVASTATIN CALCIUM 10 MG PO TABS
10.0000 mg | ORAL_TABLET | Freq: Every day | ORAL | 3 refills | Status: DC
Start: 2020-02-02 — End: 2020-04-12

## 2020-02-01 MED ORDER — ASPIRIN EC 81 MG PO TBEC: 81.0000 mg | DELAYED_RELEASE_TABLET | Freq: Every day | ORAL | 2 refills | Status: AC

## 2020-02-01 NOTE — Discharge Summary (Addendum)
Discharge Summary    Patient ID: Heidi Evans MRN: [id number]; DOB: 09/27/1955  Admit date: 01/30/2020 Discharge date: 02/01/2020  Primary Care Provider: Sherald Hess., MD  Primary Cardiologist: Dorris Carnes, MD  Primary Electrophysiologist:  None   Discharge Diagnoses    Active Problems:   STEMI (ST elevation myocardial infarction) [facility])   Takotsubo cardiomyopathy   Myocardial bridge  Principle problem: Takotsubo cardiomyopathy  Diagnostic Studies/Procedures    Echo 01/31/20: 1. Left ventricular ejection fraction, by estimation, is 30 to 35%. The  left ventricle has moderately decreased function. The left ventricle  demonstrates regional wall motion abnormalities with mid to apical  anteroseptal akinesis, mid to apical anterior   akinesis, mid to apical inferior akinesis, apical lateral akinesis, and  akinesis of the true apex. No LV thrombus. The LV has the appearance of  LAD-territory infarct. Left ventricular diastolic parameters are  consistent with Grade II diastolic  dysfunction (pseudonormalization).   2. Right ventricular systolic function is normal. The right ventricular  size is normal. There is normal pulmonary artery systolic pressure. The  estimated right ventricular systolic pressure is 08.1 mmHg.   3. The mitral valve is normal in structure. Trivial mitral valve  regurgitation. No evidence of mitral stenosis.   4. The aortic valve is tricuspid. Aortic valve regurgitation is not  visualized. Mild aortic valve sclerosis is present, with no evidence of  aortic valve stenosis.   5. The inferior vena cava is normal in size with <50% respiratory  variability, suggesting right atrial pressure of 8 mmHg.      Left heart cath 01/30/20: Acute coronary syndrome most likely secondary to a component of vasospasm in the very proximal circumflex vessel.  There appears to be angulated systolic muscle bridging of the very proximal circumflex narrowing to 70% during  systole on a sharp bend in an otherwise normal-appearing circumflex vessel with significant vessel tortuosity of the obtuse marginal branches.  The LAD is a large normal vessel which wraps around the apex.  The RCA is angiographically normal.   Low normal global LV function with EF estimated at 50% without definitive wall motion abnormality.  LVEDP is increased to 24 mm.   RECOMMENDATION: Initial troponin level 4097.  The patient will be transported to [facility].  She will be started on amlodipine 5 mg to reduce potential vasospasm.  A 2D echo Doppler study will be done in a.m.  Serial troponins will be obtained.  Due to her significant increased stress contributing to today's symptoms, she may require antianxiolytic therapy. _____________   History of Present Illness     Heidi Evans is a 64 y.o. female with no prior cardiac history presented with chest pressure and ST elevation prompting activation of CODE STEMI by EMS.  She received some disturbing news and developed chest pressure and squeezing. No SOB   +Weak   + diaphoretic.   No relief with ASA  EMS called  Per their note pt was tachycardic, diaphoretic. EKG done by EMS and COde STEMI activated.   In ED, EKG showed SR 66 bpm  1-2 mm ST elevation in inferior and lateral leads. HS Trop [id number] .   Pt was taken emergently to the cath lab for revascularization.  She is VERY anxious and tearful    Hospital Course     Consultants: none  Takotsubo stress cardiomyopathy STEMI Episode of chest pressure occurred after receiving some troubling news. Left heart cath showed evidence of angulation and possible myocardial bridge of  proximal Cx that could have contributed to her CP. Episode felt to be consistent with vasospasm. Dr. Burt Knack reviewed images and questioned the contribution of the muscle bridge in her symptoms. Heart cath with no obstructive disease. HS trop initially 4097, then down-trended.   Echocardiogram showed large periapical wall  motion abnormality and vigorous basal LV function. EF 30-35%. She was started on carvedilol and transferred to telemetry floor. Pressure are marginal, would like to add ARB if pressure allows in outpatient setting.   Today she is feeling well with no further CP. Will repeat an echo in 3 months. Discussed 1800 mg sodium restriction.   Discharged on coreg and ASA.      Risk factor modification 01/31/2020: Cholesterol 153; HDL 84; LDL Cholesterol 58; Triglycerides 53; VLDL 11 - do not feel strongly about continuing 80 mg lipitor with the above cholesterol profile --> discharged on 10 mg lipitor - A1c 5.3%   Anxiety She has suffered multiple losses over the last year and is grieving. We had a long discussion regarding coping and virtual counselors.      Had a long discussion regarding low sodium diet and coping strategies for stressful events.    Did the patient have an acute coronary syndrome (MI, NSTEMI, STEMI, etc) this admission?:  Yes                               AHA/ACC Clinical Performance & Quality Measures: Aspirin prescribed? - Yes ADP Receptor Inhibitor (Plavix/Clopidogrel, Brilinta/Ticagrelor or Effient/Prasugrel) prescribed (includes medically managed patients)? - No - not ACS, no intevention Beta Blocker prescribed? - Yes High Intensity Statin (Lipitor 40-80mg  or Crestor 20-40mg ) prescribed? - No - 10 mg lipitor EF assessed during THIS hospitalization? - Yes For EF <40%, was ACEI/ARB prescribed? - No - Reason:  hypotension For EF <40%, Aldosterone Antagonist (Spironolactone or Eplerenone) prescribed? - No - Reason:  hypotension Cardiac Rehab Phase II ordered (including medically managed patients)? - Yes   _____________  Discharge Vitals Blood pressure 101/66, pulse 82, temperature 98.7 F (37.1 C), temperature source Oral, resp. rate 18, height 5\' 2"  (1.575 m), weight 50.3 kg, SpO2 98 %.  Filed Weights   01/31/20 0127 01/31/20 1251 02/01/20 0351  Weight: 49.9 kg 50.9  kg 50.3 kg    Labs & Radiologic Studies    CBC Recent Labs    01/30/20 2112 01/31/20 0219  WBC 14.2* 11.5*  NEUTROABS 12.2*  --   HGB 13.6 12.4  HCT 41.1 36.4  MCV 91.9 90.8  PLT 276 073   Basic Metabolic Panel Recent Labs    01/30/20 2112 01/31/20 0219  NA 135 137  K 3.3* 3.6  CL 99 100  CO2 18* 24  GLUCOSE 152* 146*  BUN 9 9  CREATININE 0.78 0.75  CALCIUM 9.6 9.0   Liver Function Tests Recent Labs    01/30/20 2112  AST 52*  ALT 28  ALKPHOS 44  BILITOT 1.2  PROT 6.6  ALBUMIN 4.5   No results for input(s): LIPASE, AMYLASE in the last 72 hours. High Sensitivity Troponin:   Recent Labs  Lab 01/30/20 2112 01/31/20 0219 01/31/20 0553  TROPONINIHS 4,097* 3,602* 3,665*    BNP Invalid input(s): POCBNP D-Dimer No results for input(s): DDIMER in the last 72 hours. Hemoglobin A1C Recent Labs    01/30/20 2112  HGBA1C 5.3   Fasting Lipid Panel Recent Labs    01/31/20 0219  CHOL 153  HDL  84  LDLCALC 58  TRIG 53  CHOLHDL 1.8   Thyroid Function Tests No results for input(s): TSH, T4TOTAL, T3FREE, THYROIDAB in the last 72 hours.  Invalid input(s): FREET3 _____________  CARDIAC CATHETERIZATION  Result Date: 01/30/2020  Non-stenotic Ost Cx to Prox Cx lesion.  Acute coronary syndrome most likely secondary to a component of vasospasm in the very proximal circumflex vessel.  There appears to be angulated systolic muscle bridging of the very proximal circumflex narrowing to 70% during systole on a sharp bend in an otherwise normal-appearing circumflex vessel with significant vessel tortuosity of the obtuse marginal branches.  The LAD is a large normal vessel which wraps around the apex.  The RCA is angiographically normal. Low normal global LV function with EF estimated at 50% without definitive wall motion abnormality.  LVEDP is increased to 24 mm. RECOMMENDATION: Initial troponin level 4097.  The patient will be transported to [facility].  She will be started on  amlodipine 5 mg to reduce potential vasospasm.  A 2D echo Doppler study will be done in a.m.  Serial troponins will be obtained.  Due to her significant increased stress contributing to today's symptoms, she may require antianxiolytic therapy.   DG Chest Port 1 View  Result Date: 01/30/2020 CLINICAL DATA:  Chest pain EXAM: PORTABLE CHEST 1 VIEW COMPARISON:  None. FINDINGS: The heart size and mediastinal contours are within normal limits. Both lungs are clear. The visualized skeletal structures are unremarkable. IMPRESSION: No active disease. Electronically Signed   By: Prudencio Pair M.D.   On: 01/30/2020 21:45   ECHOCARDIOGRAM COMPLETE  Result Date: 01/31/2020    ECHOCARDIOGRAM REPORT   Patient Name:   Heidi Evans Date of Exam: 01/31/2020 Medical Rec #:  [id number]       Height:       62.0 in Accession #:    [id number]      Weight:       110.0 lb Date of Birth:  Apr 07, 1956       BSA:          1.483 m Patient Age:    33 years        BP:           116/80 mmHg Patient Gender: F               HR:           72 bpm. Exam Location:  Inpatient Procedure: 2D Echo and Intracardiac Opacification Agent Indications:    Acute Cornonary Syndrome I24.9  History:        Patient has no prior history of Echocardiogram examinations.  Sonographer:    Darlina Sicilian RDCS Referring Phys: [place]  1. Left ventricular ejection fraction, by estimation, is 30 to 35%. The left ventricle has moderately decreased function. The left ventricle demonstrates regional wall motion abnormalities with mid to apical anteroseptal akinesis, mid to apical anterior  akinesis, mid to apical inferior akinesis, apical lateral akinesis, and akinesis of the true apex. No LV thrombus. The LV has the appearance of LAD-territory infarct. Left ventricular diastolic parameters are consistent with Grade II diastolic dysfunction (pseudonormalization).  2. Right ventricular systolic function is normal. The right ventricular size is  normal. There is normal pulmonary artery systolic pressure. The estimated right ventricular systolic pressure is 74.0 mmHg.  3. The mitral valve is normal in structure. Trivial mitral valve regurgitation. No evidence of mitral stenosis.  4. The aortic valve is tricuspid. Aortic valve regurgitation  is not visualized. Mild aortic valve sclerosis is present, with no evidence of aortic valve stenosis.  5. The inferior vena cava is normal in size with <50% respiratory variability, suggesting right atrial pressure of 8 mmHg. FINDINGS  Left Ventricle: Left ventricular ejection fraction, by estimation, is 30 to 35%. The left ventricle has moderately decreased function. The left ventricle demonstrates regional wall motion abnormalities. Definity contrast agent was given IV to delineate the left ventricular endocardial borders. The left ventricular internal cavity size was normal in size. There is no left ventricular hypertrophy. Left ventricular diastolic parameters are consistent with Grade II diastolic dysfunction (pseudonormalization). Right Ventricle: The right ventricular size is normal. No increase in right ventricular wall thickness. Right ventricular systolic function is normal. There is normal pulmonary artery systolic pressure. The tricuspid regurgitant velocity is 2.17 m/s, and  with an assumed right atrial pressure of 8 mmHg, the estimated right ventricular systolic pressure is 19.3 mmHg. Left Atrium: Left atrial size was normal in size. Right Atrium: Right atrial size was normal in size. Pericardium: There is no evidence of pericardial effusion. Mitral Valve: The mitral valve is normal in structure. Mild mitral annular calcification. Trivial mitral valve regurgitation. No evidence of mitral valve stenosis. Tricuspid Valve: The tricuspid valve is normal in structure. Tricuspid valve regurgitation is mild. Aortic Valve: The aortic valve is tricuspid. Aortic valve regurgitation is not visualized. Mild aortic valve  sclerosis is present, with no evidence of aortic valve stenosis. Pulmonic Valve: The pulmonic valve was normal in structure. Pulmonic valve regurgitation is not visualized. Aorta: The aortic root is normal in size and structure. Venous: The inferior vena cava is normal in size with less than 50% respiratory variability, suggesting right atrial pressure of 8 mmHg. IAS/Shunts: No atrial level shunt detected by color flow Doppler.  LEFT VENTRICLE PLAX 2D LVOT diam:     1.60 cm      Diastology LV SV:         28           LV e' lateral:   7.28 cm/s LV SV Index:   19           LV E/e' lateral: 11.6 LVOT Area:     2.01 cm     LV e' medial:    5.40 cm/s                             LV E/e' medial:  15.7  LV Volumes (MOD) LV vol d, MOD A2C: 107.0 ml LV vol d, MOD A4C: 115.0 ml LV vol s, MOD A2C: 82.6 ml LV vol s, MOD A4C: 59.7 ml LV SV MOD A2C:     24.4 ml LV SV MOD A4C:     115.0 ml LV SV MOD BP:      42.4 ml RIGHT VENTRICLE RV S prime:     7.88 cm/s TAPSE (M-mode): 1.5 cm LEFT ATRIUM             Index       RIGHT ATRIUM          Index LA Vol (A2C):   37.8 ml 25.49 ml/m RA Area:     7.88 cm LA Vol (A4C):   37.2 ml 25.09 ml/m RA Volume:   14.70 ml 9.91 ml/m LA Biplane Vol: 37.8 ml 25.49 ml/m  AORTIC VALVE LVOT Vmax:   75.10 cm/s LVOT Vmean:  51.600 cm/s LVOT VTI:  0.139 m  AORTA Ao Root diam: 2.70 cm MITRAL VALVE               TRICUSPID VALVE MV Area (PHT): 4.80 cm    TR Peak grad:   18.8 mmHg MV Decel Time: 158 msec    TR Vmax:        217.00 cm/s MV E velocity: 84.80 cm/s MV A velocity: 63.80 cm/s  SHUNTS MV E/A ratio:  1.33        Systemic VTI:  0.14 m                            Systemic Diam: 1.60 cm Loralie Champagne MD Electronically signed by Loralie Champagne MD Signature Date/Time: 01/31/2020/3:00:08 PM    Final    Disposition   Pt is being discharged home today in good condition.  Follow-up Plans & Appointments     Follow-up Information     Liliane Shi, PA-C Follow up on 02/13/2020.   Specialties:  Cardiology, Physician Assistant Why: 11:45 am Contact information: [address] [place] [address] [phone]                Discharge Instructions     Amb Referral to Cardiac Rehabilitation   Complete by: As directed    Diagnosis: STEMI   After initial evaluation and assessments completed: Virtual Based Care may be provided alone or in conjunction with Phase 2 Cardiac Rehab based on patient barriers.: Yes   Diet - low sodium heart healthy   Complete by: As directed    Discharge instructions   Complete by: As directed    No driving for 1 week. No lifting over 5 lbs for 1 week. No sexual activity for 1 week. You may return to work in 1 week. Keep procedure site clean & dry. If you notice increased pain, swelling, bleeding or pus, call/return!  You may shower, but no soaking baths/hot tubs/pools for 1 week.   Increase activity slowly   Complete by: As directed        Discharge Medications   Allergies as of 02/01/2020       Reactions   Codeine Itching   Other Other (See Comments)   Some anesthesia medications cause her nausea (unknown which)        Medication List     STOP taking these medications    Erenumab-aooe 70 MG/ML Soaj Commonly known as: Aimovig       TAKE these medications    aspirin EC 81 MG tablet Take 1 tablet (81 mg total) by mouth daily. Swallow whole.   atorvastatin 10 MG tablet Commonly known as: LIPITOR Take 1 tablet (10 mg total) by mouth daily. Start taking on: February 02, 2020   carvedilol 3.125 MG tablet Commonly known as: COREG Take 1 tablet (3.125 mg total) by mouth 2 (two) times daily with a meal.   cyclobenzaprine 10 MG tablet Commonly known as: FLEXERIL Take 10 mg by mouth at bedtime as needed for spasms.   diazepam 5 MG tablet Commonly known as: VALIUM Take 5 mg by mouth daily as needed for anxiety.   gabapentin 100 MG capsule Commonly known as: NEURONTIN Take 200 mg by mouth at bedtime as  needed (sleep).   nitroGLYCERIN 0.4 MG SL tablet Commonly known as: NITROSTAT Place 1 tablet (0.4 mg total) under the tongue every 5 (five) minutes x 3 doses as needed for chest pain.  NP Thyroid 60 MG tablet Generic drug: thyroid Take 60 mg by mouth daily.   ondansetron 4 MG disintegrating tablet Commonly known as: Zofran ODT Take 1 tablet (4 mg total) by mouth every 8 (eight) hours as needed.   rizatriptan 10 MG disintegrating tablet Commonly known as: Maxalt-MLT Take 1 tablet (10 mg total) by mouth as needed. May repeat in 2 hours if needed, no refill in less than 30 days What changed: reasons to take this           Outstanding Labs/Studies   Echo in 3 months  Duration of Discharge Encounter   Greater than 30 minutes including physician time.  Signed, Tami Lin Duke, PA 02/01/2020, 11:35 AM   I have personally seen and examined this patient. I agree with the assessment and plan as outlined above.  See my full note from this am. Plan to d/c home today.   Lauree Chandler 02/01/2020 12:56 PM

## 2020-02-01 NOTE — Telephone Encounter (Signed)
**Note De-identified Laterria Lasota Obfuscation** -----  **Note De-Identified Angelissa Supan Obfuscation** Message from Ledora Bottcher, [place] sent at 02/01/2020  9:08 AM EDT ----- PT will need a TOC with Dr. Harrington Challenger' team in 7-10 days.  Thanks Angie

## 2020-02-01 NOTE — Plan of Care (Signed)

## 2020-02-01 NOTE — Progress Notes (Signed)
D/C instructions given and reviewed. No questions asked but encouraged to call with any concerns. IV's removed. Tolerated well. Waiting on spouse to transport.

## 2020-02-01 NOTE — Progress Notes (Addendum)
Progress Note  Patient Name: Heidi Evans Date of Encounter: 02/01/2020  [facility] Cardiologist: Dorris Carnes, MD   Subjective   No chest pain, anticipating discharge today  Inpatient Medications    Scheduled Meds: . aspirin  324 mg Oral Once  . aspirin  81 mg Oral Daily  . atorvastatin  80 mg Oral Daily  . carvedilol  3.125 mg Oral BID WC  . Chlorhexidine Gluconate Cloth  6 each Topical Daily  . enoxaparin (LOVENOX) injection  40 mg Subcutaneous Q24H  . feeding supplement (ENSURE ENLIVE)  237 mL Oral BID BM  . sodium chloride flush  3 mL Intravenous Q12H  . thyroid  60 mg Oral QAC breakfast   Continuous Infusions: . sodium chloride     PRN Meds: sodium chloride, acetaminophen, cyclobenzaprine, diazepam, gabapentin, nitroGLYCERIN, ondansetron (ZOFRAN) IV, ondansetron, sodium chloride flush   Vital Signs    Vitals:   01/31/20 1943 02/01/20 0028 02/01/20 0351 02/01/20 0827  BP: 106/60 108/61 114/72 101/66  Pulse: 77 91 87 82  Resp: 17 17 20 18   Temp: 99.7 F (37.6 C) 98.5 F (36.9 C) 99 F (37.2 C) 98.7 F (37.1 C)  TempSrc: Oral  Oral Oral  SpO2: 95% 95% 98% 98%  Weight:   50.3 kg   Height:        Intake/Output Summary (Last 24 hours) at 02/01/2020 0834 Last data filed at 02/01/2020 0400 Gross per 24 hour  Intake 240 ml  Output 2 ml  Net 238 ml   Last 3 Weights 02/01/2020 01/31/2020 01/31/2020  Weight (lbs) 111 lb 112 lb 3.4 oz 110 lb 0.2 oz  Weight (kg) 50.349 kg 50.9 kg 49.9 kg      Telemetry    Sinus rhythm with HR 70-80s - Personally Reviewed  ECG    No new tracings - Personally Reviewed  Physical Exam   GEN: No acute distress.   Neck: No JVD Cardiac: RRR, no murmurs, rubs, or gallops.  Respiratory: Clear to auscultation bilaterally. GI: Soft, nontender, non-distended  MS: No edema; No deformity. Neuro:  Nonfocal  Psych: Normal affect  Right groin C/D/I  Labs    High Sensitivity Troponin:   Recent Labs  Lab 01/30/20 2112  01/31/20 0219 01/31/20 0553  TROPONINIHS 4,097* 3,602* 3,665*      Chemistry Recent Labs  Lab 01/30/20 2112 01/31/20 0219  NA 135 137  K 3.3* 3.6  CL 99 100  CO2 18* 24  GLUCOSE 152* 146*  BUN 9 9  CREATININE 0.78 0.75  CALCIUM 9.6 9.0  PROT 6.6  --   ALBUMIN 4.5  --   AST 52*  --   ALT 28  --   ALKPHOS 44  --   BILITOT 1.2  --   GFRNONAA >60 >60  GFRAA >60 >60  ANIONGAP 18* 13     Hematology Recent Labs  Lab 01/30/20 2112 01/31/20 0219  WBC 14.2* 11.5*  RBC 4.47 4.01  HGB 13.6 12.4  HCT 41.1 36.4  MCV 91.9 90.8  MCH 30.4 30.9  MCHC 33.1 34.1  RDW 12.6 12.7  PLT 276 253    BNPNo results for input(s): BNP, PROBNP in the last 168 hours.   DDimer No results for input(s): DDIMER in the last 168 hours.   Radiology    CARDIAC CATHETERIZATION  Result Date: 01/30/2020  Non-stenotic Ost Cx to Prox Cx lesion.  Acute coronary syndrome most likely secondary to a component of vasospasm in the very proximal circumflex  vessel.  There appears to be angulated systolic muscle bridging of the very proximal circumflex narrowing to 70% during systole on a sharp bend in an otherwise normal-appearing circumflex vessel with significant vessel tortuosity of the obtuse marginal branches.  The LAD is a large normal vessel which wraps around the apex.  The RCA is angiographically normal. Low normal global LV function with EF estimated at 50% without definitive wall motion abnormality.  LVEDP is increased to 24 mm. RECOMMENDATION: Initial troponin level 4097.  The patient will be transported to [facility].  She will be started on amlodipine 5 mg to reduce potential vasospasm.  A 2D echo Doppler study will be done in a.m.  Serial troponins will be obtained.  Due to her significant increased stress contributing to today's symptoms, she may require antianxiolytic therapy.   DG Chest Port 1 View  Result Date: 01/30/2020 CLINICAL DATA:  Chest pain EXAM: PORTABLE CHEST 1 VIEW COMPARISON:  None.  FINDINGS: The heart size and mediastinal contours are within normal limits. Both lungs are clear. The visualized skeletal structures are unremarkable. IMPRESSION: No active disease. Electronically Signed   By: Prudencio Pair M.D.   On: 01/30/2020 21:45   ECHOCARDIOGRAM COMPLETE  Result Date: 01/31/2020    ECHOCARDIOGRAM REPORT   Patient Name:   Heidi Evans Date of Exam: 01/31/2020 Medical Rec #:  [id number]       Height:       62.0 in Accession #:    [id number]      Weight:       110.0 lb Date of Birth:  [birth year]       BSA:          1.483 m Patient Age:    64 years        BP:           116/80 mmHg Patient Gender: F               HR:           72 bpm. Exam Location:  Inpatient Procedure: 2D Echo and Intracardiac Opacification Agent Indications:    Acute Cornonary Syndrome I24.9  History:        Patient has no prior history of Echocardiogram examinations.  Sonographer:    Darlina Sicilian RDCS Referring Phys: Ferguson  1. Left ventricular ejection fraction, by estimation, is 30 to 35%. The left ventricle has moderately decreased function. The left ventricle demonstrates regional wall motion abnormalities with mid to apical anteroseptal akinesis, mid to apical anterior  akinesis, mid to apical inferior akinesis, apical lateral akinesis, and akinesis of the true apex. No LV thrombus. The LV has the appearance of LAD-territory infarct. Left ventricular diastolic parameters are consistent with Grade II diastolic dysfunction (pseudonormalization).  2. Right ventricular systolic function is normal. The right ventricular size is normal. There is normal pulmonary artery systolic pressure. The estimated right ventricular systolic pressure is 89.3 mmHg.  3. The mitral valve is normal in structure. Trivial mitral valve regurgitation. No evidence of mitral stenosis.  4. The aortic valve is tricuspid. Aortic valve regurgitation is not visualized. Mild aortic valve sclerosis is present, with no evidence  of aortic valve stenosis.  5. The inferior vena cava is normal in size with <50% respiratory variability, suggesting right atrial pressure of 8 mmHg. FINDINGS  Left Ventricle: Left ventricular ejection fraction, by estimation, is 30 to 35%. The left ventricle has moderately decreased function. The left ventricle demonstrates regional wall motion  abnormalities. Definity contrast agent was given IV to delineate the left ventricular endocardial borders. The left ventricular internal cavity size was normal in size. There is no left ventricular hypertrophy. Left ventricular diastolic parameters are consistent with Grade II diastolic dysfunction (pseudonormalization). Right Ventricle: The right ventricular size is normal. No increase in right ventricular wall thickness. Right ventricular systolic function is normal. There is normal pulmonary artery systolic pressure. The tricuspid regurgitant velocity is 2.17 m/s, and  with an assumed right atrial pressure of 8 mmHg, the estimated right ventricular systolic pressure is 13.2 mmHg. Left Atrium: Left atrial size was normal in size. Right Atrium: Right atrial size was normal in size. Pericardium: There is no evidence of pericardial effusion. Mitral Valve: The mitral valve is normal in structure. Mild mitral annular calcification. Trivial mitral valve regurgitation. No evidence of mitral valve stenosis. Tricuspid Valve: The tricuspid valve is normal in structure. Tricuspid valve regurgitation is mild. Aortic Valve: The aortic valve is tricuspid. Aortic valve regurgitation is not visualized. Mild aortic valve sclerosis is present, with no evidence of aortic valve stenosis. Pulmonic Valve: The pulmonic valve was normal in structure. Pulmonic valve regurgitation is not visualized. Aorta: The aortic root is normal in size and structure. Venous: The inferior vena cava is normal in size with less than 50% respiratory variability, suggesting right atrial pressure of 8 mmHg.  IAS/Shunts: No atrial level shunt detected by color flow Doppler.  LEFT VENTRICLE PLAX 2D LVOT diam:     1.60 cm      Diastology LV SV:         28           LV e' lateral:   7.28 cm/s LV SV Index:   19           LV E/e' lateral: 11.6 LVOT Area:     2.01 cm     LV e' medial:    5.40 cm/s                             LV E/e' medial:  15.7  LV Volumes (MOD) LV vol d, MOD A2C: 107.0 ml LV vol d, MOD A4C: 115.0 ml LV vol s, MOD A2C: 82.6 ml LV vol s, MOD A4C: 59.7 ml LV SV MOD A2C:     24.4 ml LV SV MOD A4C:     115.0 ml LV SV MOD BP:      42.4 ml RIGHT VENTRICLE RV S prime:     7.88 cm/s TAPSE (M-mode): 1.5 cm LEFT ATRIUM             Index       RIGHT ATRIUM          Index LA Vol (A2C):   37.8 ml 25.49 ml/m RA Area:     7.88 cm LA Vol (A4C):   37.2 ml 25.09 ml/m RA Volume:   14.70 ml 9.91 ml/m LA Biplane Vol: 37.8 ml 25.49 ml/m  AORTIC VALVE LVOT Vmax:   75.10 cm/s LVOT Vmean:  51.600 cm/s LVOT VTI:    0.139 m  AORTA Ao Root diam: 2.70 cm MITRAL VALVE               TRICUSPID VALVE MV Area (PHT): 4.80 cm    TR Peak grad:   18.8 mmHg MV Decel Time: 158 msec    TR Vmax:        217.00 cm/s MV E velocity: 84.80  cm/s MV A velocity: 63.80 cm/s  SHUNTS MV E/A ratio:  1.33        Systemic VTI:  0.14 m                            Systemic Diam: 1.60 cm Loralie Champagne MD Electronically signed by Loralie Champagne MD Signature Date/Time: 01/31/2020/3:00:08 PM    Final     Cardiac Studies   Echo 01/31/20: 1. Left ventricular ejection fraction, by estimation, is 30 to 35%. The  left ventricle has moderately decreased function. The left ventricle  demonstrates regional wall motion abnormalities with mid to apical  anteroseptal akinesis, mid to apical anterior  akinesis, mid to apical inferior akinesis, apical lateral akinesis, and  akinesis of the true apex. No LV thrombus. The LV has the appearance of  LAD-territory infarct. Left ventricular diastolic parameters are  consistent with Grade II diastolic  dysfunction  (pseudonormalization).  2. Right ventricular systolic function is normal. The right ventricular  size is normal. There is normal pulmonary artery systolic pressure. The  estimated right ventricular systolic pressure is 29.9 mmHg.  3. The mitral valve is normal in structure. Trivial mitral valve  regurgitation. No evidence of mitral stenosis.  4. The aortic valve is tricuspid. Aortic valve regurgitation is not  visualized. Mild aortic valve sclerosis is present, with no evidence of  aortic valve stenosis.  5. The inferior vena cava is normal in size with <50% respiratory  variability, suggesting right atrial pressure of 8 mmHg.    Left heart cath 01/30/20: Acute coronary syndrome most likely secondary to a component of vasospasm in the very proximal circumflex vessel.  There appears to be angulated systolic muscle bridging of the very proximal circumflex narrowing to 70% during systole on a sharp bend in an otherwise normal-appearing circumflex vessel with significant vessel tortuosity of the obtuse marginal branches.  The LAD is a large normal vessel which wraps around the apex.  The RCA is angiographically normal.  Low normal global LV function with EF estimated at 50% without definitive wall motion abnormality.  LVEDP is increased to 24 mm.  RECOMMENDATION: Initial troponin level 4097.  The patient will be transported to [facility].  She will be started on amlodipine 5 mg to reduce potential vasospasm.  A 2D echo Doppler study will be done in a.m.  Serial troponins will be obtained.  Due to her significant increased stress contributing to today's symptoms, she may require antianxiolytic therapy.    Patient Profile     64 y.o. female with no prior cardiac history presented with chest pressure and ST elevation prompting activation of CODE STEMI by EMS.  Assessment & Plan    Takotsubo stress cardiomyopathy STEMI - episode of chest pressure occurred after receiving some troubling nees -  left heart cath showed evidence of angulation and possible myocardial bridge of proximal Cx that could have contributed to her CP - episode felt to be consistent with vasospasm - no obstructive disease - hs trop initially 4097, then down-trended - echo showed large periapical wall motion abnormality and vigorous basal LV function - EF 30-35% - she was started on carvedilol and transferred to telemetry floor - she is feeling well with no further CP - anticipate discharge today - will repeat an echo in 3 months   Risk factor modification 01/31/2020: Cholesterol 153; HDL 84; LDL Cholesterol 58; Triglycerides 53; VLDL 11 - do not feel strongly about continuing 80 mg  lipitor with the above cholesterol profile - A1c 5.3%   Had a long discussion regarding low sodium diet and coping strategies for stressful events.    For questions or updates, please contact [place] Please consult [email or web address] for contact info under        Signed, Ledora Bottcher, PA  02/01/2020, 8:34 AM    I have personally seen and examined this patient. I agree with the assessment and plan as outlined above. She likely has stress induced cardiomyopathy. LVEF around 35% with classic apical wall motion abnormality Doing well today. Discharge home on ASA, Coreg. BP will not tolerate addition of an ARB or Ace-inh.  Lower Lipitor to 10 mg daily  Discharge home today.   Lauree Chandler 02/01/2020 11:23 AM

## 2020-02-01 NOTE — Progress Notes (Signed)
CARDIAC REHAB PHASE I   PRE:  Rate/Rhythm: 80 SR    BP: sitting 100/69    SaO2:   MODE:  Ambulation: 600 ft   POST:  Rate/Rhythm: 95 SR    BP: sitting 100/74     SaO2:   Tolerated well, no c/o. BP low but stable, no c/o. She is working through her anxiety and has Social worker. She generally does not like to take meds. Discussed reasons for some of her meds and encouraged good communication with MD. Discussed daily wts, low sodium and fluid intake. She is receptive.  [id number]  [place], ACSM 02/01/2020 10:13 AM

## 2020-02-02 NOTE — Telephone Encounter (Signed)
**Note De-Identified Heidi Evans Obfuscation** Patient contacted regarding discharge from  [facility] on 02/01/2020.  Patient understands to follow up with provider Richardson Dopp, PA-c on 09/13.2021 at 11:45 at [address] [place] in [place], [place] [address]. Patient understands discharge instructions? Yes Patient understands medications and regiment? Yes Patient understands to bring all medications to this visit? Yes  Ask patient:  Are you enrolled in My Chart: Yes

## 2020-02-12 NOTE — Progress Notes (Signed)
Cardiology Office Note:    Date:  02/13/2020   ID:  Heidi Evans, DOB 09-04-1955, MRN [id number]  PCP:  System, Pcp Not In  [facility] Cardiologist:  Dorris Carnes, MD   [facility] Electrophysiologist:  None   Referring MD: Heidi Hess., MD   Chief Complaint:  Hospitalization Follow-up (s/p ACS - Tako Tsubo CM)    Patient Profile:    Heidi Evans is a 64 y.o. female with:   Tako-Tsubo CM  Anxiety   Hypothyroidism   Prior CV studies: Echocardiogram 01/31/2020 EF 30-35, ant-sept, ant, inf lat and apical AK, Gr 2 DD, normal RVSF, RVSP 26.8, trivial MR  Cardiac catheterization 01/30/20 Acute coronary syndrome most likely secondary to a component of vasospasm in the very proximal circumflex vessel.  There appears to be angulated systolic muscle bridging of the very proximal circumflex narrowing to 70% during systole on a sharp bend in an otherwise normal-appearing circumflex vessel with significant vessel tortuosity of the obtuse marginal branches.  The LAD is a large normal vessel which wraps around the apex.  The RCA is angiographically normal.  Low normal global LV function with EF estimated at 50% without definitive wall motion abnormality.  LVEDP is increased to 24 mm.  RECOMMENDATION: Initial troponin level 4097.  The patient will be transported to [facility].  She will be started on amlodipine 5 mg to reduce potential vasospasm.  A 2D echo Doppler study will be done in a.m.  Serial troponins will be obtained.  Due to her significant increased stress contributing to today's symptoms, she may require antianxiolytic therapy.     History of Present Illness:    Heidi Evans was admitted 8/30-9/1 with Tako-Tsubo cardiomyopathy.  She presented with chest pain and inf and lat ST elevation.  Emergent cardiac catheterization demonstrated prox LCx angulated muscle bridging but no significant CAD.  There was some concern for vasospasm causing her presentation.  However, an  echocardiogram demonstrated EF 30-35 with typical appearance of Tako-Tsubo CM.  She returns for follow up.    She is here today with her husband.  Since discharge, she notes that she feels her heart racing at times.  This seems to be related to stress.  She has not really had chest discomfort.  She has noted shortness of breath with some activities.  This predated her presentation to the hospital.  This seems to be unchanged.  She has not had orthopnea, significant lower extremity swelling or syncope.      Past Medical History:  Diagnosis Date  . Acid reflux   . Anxiety   . Depression   . Hypothyroidism   . Migraine   . Osteoporosis   . Takotsubo cardiomyopathy    Presented with CP and Inf+Lat ST elevation >> LHC with angulated myocardial bridging in pLCx but no sig CAD // Echo 8/21: EF 30-35, ant-sept, ant, inf lat and apical AK, Gr 2 DD, normal RVSF, RVSP 26.8, trivial MR   . Vitamin D deficiency     Current Medications: Current Meds  Medication Sig  . aspirin EC 81 MG tablet Take 1 tablet (81 mg total) by mouth daily. Swallow whole.  Marland Kitchen atorvastatin (LIPITOR) 10 MG tablet Take 1 tablet (10 mg total) by mouth daily.  . carvedilol (COREG) 3.125 MG tablet Take 1 tablet (3.125 mg total) by mouth 2 (two) times daily with a meal.  . Cholecalciferol (D-3-5) 125 MCG (5000 UT) capsule Take 5,000 Units by mouth daily.  . cyclobenzaprine (  FLEXERIL) 10 MG tablet Take 10 mg by mouth at bedtime as needed for spasms.  . diazepam (VALIUM) 5 MG tablet Take 5 mg by mouth daily as needed for anxiety.   . gabapentin (NEURONTIN) 100 MG capsule Take 200 mg by mouth at bedtime as needed (sleep).   . nitroGLYCERIN (NITROSTAT) 0.4 MG SL tablet Place 1 tablet (0.4 mg total) under the tongue every 5 (five) minutes x 3 doses as needed for chest pain.  . NP THYROID 60 MG tablet Take 60 mg by mouth daily.     Allergies:   Codeine, Other, and Triptans   Social History   Tobacco Use  . Smoking status: Former  Smoker    Types: Cigarettes    Quit date: 1994    Years since quitting: 27.7  . Smokeless tobacco: Never Used  Vaping Use  . Vaping Use: Never used  Substance Use Topics  . Alcohol use: Yes    Comment: rare 1-2 times per year  . Drug use: Not Currently    Types: Marijuana    Comment: in the past     Family Hx: The patient's family history includes Bipolar disorder in an other family member; Colon cancer in her father; Headache in an other family member; Mental illness in an other family member; Migraines in her mother.  ROS   EKGs/Labs/Other Test Reviewed:    EKG:  EKG is   ordered today.  The ekg ordered today demonstrates sinus bradycardia, HR 58, normal axis, T wave inversions 2, 3, aVF, V2-V6, QTC 486, similar to prior tracings  Recent Labs: 01/30/2020: ALT 28 01/31/2020: BUN 9; Creatinine, Ser 0.75; Hemoglobin 12.4; Platelets 253; Potassium 3.6; Sodium 137   Recent Lipid Panel Lab Results  Component Value Date/Time   CHOL 153 01/31/2020 02:19 AM   TRIG 53 01/31/2020 02:19 AM   HDL 84 01/31/2020 02:19 AM   CHOLHDL 1.8 01/31/2020 02:19 AM   LDLCALC 58 01/31/2020 02:19 AM    Physical Exam:    VS:  BP 108/72   Pulse 60   Ht 5\' 2"  (1.575 m)   Wt 109 lb 9.6 oz (49.7 kg)   SpO2 97%   BMI 20.05 kg/m     Wt Readings from Last 3 Encounters:  02/13/20 109 lb 9.6 oz (49.7 kg)  02/01/20 111 lb (50.3 kg)  10/25/18 126 lb 9.6 oz (57.4 kg)     Constitutional:      Appearance: Healthy appearance. Not in distress.  Neck:     Vascular: JVD normal.  Pulmonary:     Effort: Pulmonary effort is normal.     Breath sounds: No wheezing. No rales.  Cardiovascular:     Normal rate. Regular rhythm. Normal S1. Normal S2.     Murmurs: There is no murmur.     Comments: R FA site without hematoma or bruit Pulses:    Intact distal pulses.  Edema:    Peripheral edema absent.  Abdominal:     Palpations: Abdomen is soft.  Skin:    General: Skin is warm and dry.  Neurological:       Mental Status: Alert and oriented to person, place and time.     Cranial Nerves: Cranial nerves are intact.      ASSESSMENT & PLAN:    1. Takotsubo cardiomyopathy She presented with chest discomfort and ST elevation.  Cardiac catheterization demonstrated no significant CAD.  Echocardiogram demonstrated EF 30-35 with wall motion abnormality consistent with Takotsubo cardiomyopathy.  Overall, she  seems to be doing well.  Continue aspirin, atorvastatin, carvedilol.  She notes blood pressures at home are in the 33I systolic.  At this point, I do not think she can tolerate ACE inhibitor therapy.  We will plan on a limited echo in 6 weeks to recheck her EF.  Follow-up with Dr. Harrington Challenger in 8 weeks.  2. Hypercholesteremia Continue atorvastatin.  Arrange fasting lipids and LFTs in 3 months.  3. Palpitations I suspect that she is having PACs or PVCs from stress.  However, she does have an EF of 30-35, I will obtain a 3-day ZIO monitor to rule out ventricular arrhythmias.  4. Ehlers-Danlos syndrome She notes her primary care physician is recently diagnosed her with Ehlers-Danlos.  It sounds as though she has been diagnosed with hypermobility Ehlers-Danlos.  It may be worthwhile to undergo genetic testing to rule out the possibility of vascular EDS.  I explained to the patient that we have a geneticist at our office that can perform testing here.  She should follow-up with primary care to determine if this is necessary.  5. Anxiety I have asked her to follow up with primary care to help manage her anxiety.  Although rare, recurrent Tako-Tsubo CM can occur.      Dispo:  Return in about 8 weeks (around 04/09/2020) for Follow up after testing, w/ Dr. Harrington Challenger, in person.   Medication Adjustments/Labs and Tests Ordered: Current medicines are reviewed at length with the patient today.  Concerns regarding medicines are outlined above.  Tests Ordered: Orders Placed This Encounter  Procedures  . Hepatic  function panel  . Lipid panel  . LONG TERM MONITOR (3-14 DAYS)  . EKG 12-Lead  . ECHOCARDIOGRAM LIMITED   Medication Changes: No orders of the defined types were placed in this encounter.   Signed, Richardson Dopp, PA-C  02/13/2020 12:45 PM    [facility] [place], [place], [place]  [address] Phone: [phone]; Fax: [phone]

## 2020-02-13 ENCOUNTER — Other Ambulatory Visit: Payer: Self-pay

## 2020-02-13 ENCOUNTER — Encounter: Payer: Self-pay | Admitting: Physician Assistant

## 2020-02-13 ENCOUNTER — Ambulatory Visit: Payer: Medicare Other | Admitting: Physician Assistant

## 2020-02-13 VITALS — BP 108/72 | HR 60 | Ht 62.0 in | Wt 109.6 lb

## 2020-02-13 DIAGNOSIS — R002 Palpitations: Secondary | ICD-10-CM | POA: Diagnosis not present

## 2020-02-13 DIAGNOSIS — E7800 Pure hypercholesterolemia, unspecified: Secondary | ICD-10-CM

## 2020-02-13 DIAGNOSIS — I5181 Takotsubo syndrome: Secondary | ICD-10-CM

## 2020-02-13 DIAGNOSIS — F419 Anxiety disorder, unspecified: Secondary | ICD-10-CM

## 2020-02-13 DIAGNOSIS — Q796 Ehlers-Danlos syndrome, unspecified: Secondary | ICD-10-CM

## 2020-02-13 NOTE — Patient Instructions (Signed)
Medication Instructions:  Your physician recommends that you continue on your current medications as directed. Please refer to the Current Medication list given to you today.  *If you need a refill on your cardiac medications before your next appointment, please call your pharmacy*  Lab Work: Your physician recommends that you return for lab work in: 3 months for fasting lipids and LFTs  Testing/Procedures: Your physician has requested that you have an limited echocardiogram in 6 weeks. Echocardiography is a painless test that uses sound waves to create images of your heart. It provides your doctor with information about the size and shape of your heart and how well your heart's chambers and valves are working. This procedure takes approximately one hour. There are no restrictions for this procedure.  A zio monitor was ordered today. It will remain on for 3 days. You will then return monitor and event diary in provided box. It takes 1-2 weeks for report to be downloaded and returned to [place]. We will call you with the results. If monitor falls off or has orange flashing light, please call Zio for further instructions.   Follow-Up: At [facility], you and your health needs are our priority.  As part of our continuing mission to provide you with exceptional heart care, we have created designated Provider Care Teams.  These Care Teams include your primary Cardiologist (physician) and Advanced Practice Providers (APPs -  Physician Assistants and Nurse Practitioners) who all work together to provide you with the care you need, when you need it.  Your next appointment:   8 week(s)  The format for your next appointment:   In Person  Provider:   You may see Dorris Carnes, MD or Richardson Dopp, PA-C

## 2020-02-15 ENCOUNTER — Ambulatory Visit (INDEPENDENT_AMBULATORY_CARE_PROVIDER_SITE_OTHER): Payer: Medicare Other

## 2020-02-15 DIAGNOSIS — R002 Palpitations: Secondary | ICD-10-CM

## 2020-02-23 ENCOUNTER — Encounter (HOSPITAL_COMMUNITY): Payer: Self-pay

## 2020-02-23 ENCOUNTER — Telehealth (HOSPITAL_COMMUNITY): Payer: Self-pay

## 2020-02-23 NOTE — Telephone Encounter (Signed)
Attempted to call patient in regards to Cardiac Rehab - LM on VM Mailed letter 

## 2020-02-24 ENCOUNTER — Telehealth: Payer: Self-pay | Admitting: Physician Assistant

## 2020-02-24 MED ORDER — METOPROLOL SUCCINATE ER 25 MG PO TB24: 12.5000 mg | ORAL_TABLET | Freq: Every day | ORAL | 1 refills | Status: AC

## 2020-02-24 NOTE — Telephone Encounter (Signed)
Patient returned my phone call, she is aware to discontinue Carvediolol and start Metoprolol Succinate 12.5 mg once a day. Patient aware if systolic blood pressure is less than 100 to increase fluids and add a little extra salt to her diet. Patient verbalized understanding and thanked me for the call.

## 2020-02-24 NOTE — Telephone Encounter (Signed)
I returned call to patient, she wanted her monitor results, I advised patient that the monitor results are not back yet and advised her I would call as soon as they were ready. Patient is concerned about low blood pressure readings. Her readings have been 100/73, 97/67, 95/66, 101/64, 104/80, 98/72, and on 02/23/20 90/61 and 83/64; she felt very cold and fatigued with these last two readings. Patient would like to know what is to low? Patient would also like to know when her blood pressure drops low and she feels extremely fatigued and tired should she take her second dose of Carvedilol?

## 2020-02-24 NOTE — Telephone Encounter (Signed)
I called and left patient a message to call back. 

## 2020-02-24 NOTE — Telephone Encounter (Signed)
New message:     Patient calling to get results. 

## 2020-02-24 NOTE — Telephone Encounter (Signed)
It does not sound like she will be able to tolerate Carvedilol.  We should try to keep her on a beta-blocker. DC Carvedilol. Start metoprolol succinate 12.5 mg once daily. If BP <100 and she is symptomatic, push fluids and add a little extra salt to diet to increase BP. Richardson Dopp, PA-C    02/24/2020 12:49 PM

## 2020-02-27 ENCOUNTER — Telehealth (HOSPITAL_COMMUNITY): Payer: Self-pay

## 2020-02-27 NOTE — Telephone Encounter (Signed)
Called and spoke with pt in regards to CR, pt stated she does not feel like she is ready for CR at this time. She will call when she is ready.  Closed referral

## 2020-03-02 ENCOUNTER — Telehealth: Payer: Self-pay | Admitting: Internal Medicine

## 2020-03-02 NOTE — Telephone Encounter (Signed)
Will route to monitor tech to check on status of monitor.

## 2020-03-02 NOTE — Telephone Encounter (Signed)
Patient was calling to get results for her monitor. She sent the monitor back two weeks ago and has not heard anything from Dr. Harrington Challenger yet.

## 2020-03-05 ENCOUNTER — Encounter: Payer: Self-pay | Admitting: Physician Assistant

## 2020-03-26 ENCOUNTER — Ambulatory Visit (HOSPITAL_COMMUNITY): Payer: Medicare Other | Attending: Cardiology

## 2020-03-26 ENCOUNTER — Other Ambulatory Visit: Payer: Self-pay

## 2020-03-26 DIAGNOSIS — I5181 Takotsubo syndrome: Secondary | ICD-10-CM

## 2020-03-26 LAB — ECHOCARDIOGRAM LIMITED
Area-P 1/2: 2.96 cm2
S' Lateral: 2.5 cm

## 2020-03-26 MED ORDER — PERFLUTREN LIPID MICROSPHERE
1.0000 mL | INTRAVENOUS | Status: AC | PRN
  Administered 2020-03-26: 2 mL via INTRAVENOUS

## 2020-03-27 ENCOUNTER — Encounter: Payer: Self-pay | Admitting: Physician Assistant

## 2020-03-30 ENCOUNTER — Telehealth: Payer: Self-pay

## 2020-03-30 MED ORDER — SPIRONOLACTONE 25 MG PO TABS: ORAL_TABLET | ORAL | 3 refills | Status: DC

## 2020-03-30 NOTE — Telephone Encounter (Signed)
I called and spoke with patient about echocardiogram results. Patient states that in the mornings she does have some shortness of breath with activities such as cleaning and going up and down the stairs, patient does report fatigue with this as well. Patient states that the shortness of breath and fatigue get better as the day progresses. She wants to know when she can start cardiac rehab. Patient also reports blood pressure going up and down. Her readings over the last few days have been 105/68, 96/59, 104/58, 146/76, 136/89, 107/83, 145/82, and 97/62.

## 2020-03-30 NOTE — Telephone Encounter (Signed)
Tell pt to make sure she is checking BP after sitting still for at least 15 mins, feet flat on the floor, arm at heart level and no caffeine or vigorous exercise 30 mins before checking BP.  We can try her on low dose diuretic to see if this helps her breathing.  Since her BP is low at times, I don't think we should have her take it every day.  PLAN:  Start Spironolactone 12.5 mg every Mon and Thurs only (2 days a week). BMET 2 weeks Keep appt with Dr. Harrington Challenger in November. Richardson Dopp, PA-C    03/30/2020 11:18 AM

## 2020-03-30 NOTE — Telephone Encounter (Signed)
I called and spoke with patient, she is aware on how to take blood pressure readings and to start Spironolactone 0.5 tablet by mouth twice a week on Monday and Thursday. Labs will be done on 04/12/20 when patient follows up with Dr. Harrington Challenger.

## 2020-04-04 ENCOUNTER — Telehealth: Payer: Self-pay | Admitting: Physician Assistant

## 2020-04-04 NOTE — Telephone Encounter (Signed)
New Message:    Pt says she is having lab work at her primary doctor on Friday. She would like to have her lab order from Richardson Dopp sent to Dr Romie Minus office please. She would like this, because it is so hard to get her blood and she get all of it at one time.

## 2020-04-05 NOTE — Telephone Encounter (Signed)
I called Dr. Nettie Elm office and left a message about them checking labs tomorrow. Asked their office to return my call so I can see what needs to be done about patient having her blood work drawn their tomorrow.

## 2020-04-05 NOTE — Telephone Encounter (Signed)
Dr. Jaynie Collins returned my phone call, she will draw BMET, cholesterol, and hepatic panel and send results to our office.

## 2020-04-11 NOTE — Progress Notes (Signed)
Cardiology Office Note   Date:  04/12/2020   ID:  Heidi Evans, DOB 01/02/1956, MRN [id number]  PCP:  Pcp, No  Cardiologist:   Dorris Carnes, MD       History of Present Illness: Heidi Evans is a 64 y.o. female with a history of Takotsubo CM   SHe was hospitallized in Aug 2021 with CP/ STEMI  Had very stressful event prior.    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 Swallow whole.   atorvastatin (LIPITOR) 10 MG tablet Take 1 tablet (10 mg total) by mouth daily.   Cholecalciferol (D-3-5) 125 MCG (5000 UT) capsule Take 5,000 Units by mouth daily.   cyclobenzaprine (FLEXERIL) 10 MG tablet Take 10 mg by mouth at bedtime as needed for spasms.   diazepam (VALIUM) 5 MG tablet Take 5 mg by mouth daily as needed for anxiety.    gabapentin (NEURONTIN) 100 MG capsule Take 200 mg by mouth at bedtime as needed (sleep).    metoprolol succinate (TOPROL XL) 25 MG 24 hr tablet Take 0.5 tablets (12.5 mg total) by mouth daily.   nitroGLYCERIN (NITROSTAT) 0.4 MG SL tablet Place 1 tablet (0.4 mg total) under the tongue every 5 (five) minutes x 3 doses as needed for chest pain.   NP THYROID 60 MG tablet Take 60 mg by mouth daily.   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 Towanda Malkin   LEFT HEART CATH AND CORONARY ANGIOGRAPHY N/A 01/30/2020   Procedure: LEFT HEART CATH AND CORONARY ANGIOGRAPHY;  Surgeon: Troy Sine, MD;  Location: [facility];  Service: Cardiovascular;  Laterality: N/A;   NASAL SEPTUM SURGERY  6270   NISSEN FUNDOPLICATION  3500   Dr. Johney Maine  Social History:  The patient  reports that she quit smoking about 27 years ago. Her smoking use included cigarettes. She has never used smokeless tobacco. She reports current alcohol use. She reports previous drug use. Drug: Marijuana.   Family History:  The patient's family history includes Bipolar disorder in an other family member; Colon cancer in her father; Headache in an other family member; Mental illness in an other family member; Migraines in her mother.    ROS:  Please see the history of present illness. All other systems are reviewed and  Negative to the above problem except as noted.    PHYSICAL EXAM: VS:  BP 117/76    Pulse 69    Ht 5\' 2"  (1.575 m)    Wt 115 lb 12.8 oz (52.5 kg)    SpO2 99%    BMI 21.18 kg/m   GEN: Well nourished, well developed, in no acute distress  HEENT: normal  Neck: no JVD, carotid bruits, or masses Cardiac: RRR; no murmurs  No LE edema  Respiratory:  clear to  auscultation bilaterally,  GI: soft, nontender, nondistended, + BS  No hepatomegaly  MS: no deformity Moving all extremities   Skin: warm and dry, no rash Neuro:  Strength and sensation are intact Psych: euthymic mood, full affect   EKG:  EKG is ordered today.  ECHO  03/26/20  1. Since the last study on 01/31/2020 LVEF has improved from 30-35% with apical dyskinesis to 55- 60% with no regional wall motion abnormalities. 2. Left ventricular ejection fraction, by estimation, is 55 to 60%. The left ventricle has normal function. The left ventricle demonstrates global hypokinesis. Left ventricular diastolic parameters are consistent with Grade II diastolic dysfunction (pseudonormalization). The average left ventricular global longitudinal strain is -27.2 %. The global longitudinal strain is normal. 3. Right ventricular systolic function is normal. The right ventricular size is normal. 4. Left atrial size was mildly dilated. 5. The mitral valve is normal in structure. Mild mitral valve regurgitation. No evidence of mitral stenosis. 6. The aortic valve is normal in structure. Aortic valve regurgitation is not visualized. No aortic stenosis is present. 7. The inferior vena cava is normal in size with greater than 50% respiratory variability, suggesting right atrial pressure of 3 mmHg. Lipid Panel    Component Value Date/Time   CHOL 153 01/31/2020 0219   TRIG 53 01/31/2020 0219   HDL 84 01/31/2020 0219   CHOLHDL 1.8 01/31/2020 0219   VLDL 11 01/31/2020 0219   LDLCALC 58 01/31/2020 0219      Wt Readings from Last 3 Encounters:  04/12/20 115 lb 12.8 oz (52.5 kg)  02/13/20 109 lb 9.6 oz (49.7 kg)  02/01/20 111 lb (50.3 kg)      ASSESSMENT AND PLAN:  1   Takotsubo's CM    Episode in [facility] 2021   Echo on 10/25 showed LVEF has snormalized  I would keep on ASA for now.   Keep on Toprol XL as well   Stop Aldactone      2  Lipids   Last lipids excellent   Can pull back o n lipitor to 5  mg   F/U in 12 wks    3   Migraines   I have encouraged her to contact Dr Greer Pickerel office for f/u   She is not taking anything   4.  Ehlers-Danlos syndrome   Need to get records re dx.     Current medicines are reviewed at length with the patient  today.  The patient does not have concerns regarding medicines.  Signed, Dorris Carnes, MD  04/12/2020 10:09 AM    [address], [place], [place]  [address] Phone: [phone]; Fax: [phone]

## 2020-04-12 ENCOUNTER — Ambulatory Visit: Payer: Medicare Other | Admitting: Internal Medicine

## 2020-04-12 ENCOUNTER — Other Ambulatory Visit: Payer: Self-pay

## 2020-04-12 ENCOUNTER — Encounter: Payer: Self-pay | Admitting: Internal Medicine

## 2020-04-12 VITALS — BP 117/76 | HR 69 | Ht 62.0 in | Wt 115.8 lb

## 2020-04-12 DIAGNOSIS — I5181 Takotsubo syndrome: Secondary | ICD-10-CM | POA: Diagnosis not present

## 2020-04-12 DIAGNOSIS — E782 Mixed hyperlipidemia: Secondary | ICD-10-CM

## 2020-04-12 DIAGNOSIS — R002 Palpitations: Secondary | ICD-10-CM | POA: Diagnosis not present

## 2020-04-12 MED ORDER — ATORVASTATIN CALCIUM 10 MG PO TABS: 5.0000 mg | ORAL_TABLET | Freq: Every day | ORAL | 3 refills | Status: AC

## 2020-04-12 NOTE — Patient Instructions (Signed)
Medication Instructions:  Your physician has recommended you make the following change in your medication:  1.) decrease atorvastatin (Lipitor) to 5 mg daily 2.) stop spironolactone  *If you need a refill on your cardiac medications before your next appointment, please call your pharmacy*   Lab Work: none If you have labs (blood work) drawn today and your tests are completely normal, you will receive your results only by: Marland Kitchen MyChart Message (if you have MyChart) OR . A paper copy in the mail If you have any lab test that is abnormal or we need to change your treatment, we will call you to review the results.   Testing/Procedures: none   Follow-Up: To be determined after records reviewed.  Other Instructions

## 2020-04-14 ENCOUNTER — Other Ambulatory Visit: Payer: Self-pay

## 2020-04-14 ENCOUNTER — Ambulatory Visit: Payer: Medicare Other | Attending: Internal Medicine

## 2020-04-14 DIAGNOSIS — Z23 Encounter for immunization: Secondary | ICD-10-CM

## 2020-04-14 NOTE — Progress Notes (Signed)
   Covid-19 Vaccination Clinic  Name:  Heidi Evans    MRN: [id number] DOB: Feb 05, 1956  04/14/2020  [name] was observed post Covid-19 immunization for 15 minutes without incident. She was provided with Vaccine Information Sheet and instruction to access the V-Safe system.   [name] was instructed to call 911 with any severe reactions post vaccine: Marland Kitchen Difficulty breathing  . Swelling of face and throat  . A fast heartbeat  . A bad rash all over body  . Dizziness and weakness   Immunizations Administered    Name Date Dose VIS Date Route   Pfizer COVID-19 Vaccine 04/14/2020 12:00 PM 0.3 mL 03/21/2020 Intramuscular   Manufacturer: [place]   Lot: [id number]   [place]: [id number]

## 2020-04-24 NOTE — Telephone Encounter (Signed)
[facility] faxed request for medical records to [facility] this morning 04/24/20

## 2020-04-25 NOTE — Telephone Encounter (Signed)
[address]  received Medical records from [facility] 11/23 placed in Dr. Harrington Challenger mailbox

## 2020-04-30 NOTE — Telephone Encounter (Signed)
Records given to Dr. Harrington Challenger for review.

## 2020-05-14 ENCOUNTER — Other Ambulatory Visit: Payer: Medicare Other

## 2021-12-04 ENCOUNTER — Other Ambulatory Visit: Payer: Self-pay | Admitting: Orthopedic Surgery

## 2021-12-23 ENCOUNTER — Ambulatory Visit (HOSPITAL_BASED_OUTPATIENT_CLINIC_OR_DEPARTMENT_OTHER): Admit: 2021-12-23 | Payer: Medicare Other | Admitting: Orthopedic Surgery

## 2021-12-23 ENCOUNTER — Encounter (HOSPITAL_BASED_OUTPATIENT_CLINIC_OR_DEPARTMENT_OTHER): Payer: Self-pay

## 2021-12-23 SURGERY — CYST REMOVAL
Anesthesia: Choice | Laterality: Right

## 2023-11-30 ENCOUNTER — Encounter: Payer: Self-pay | Admitting: *Deleted
